# Patient Record
Sex: Female | Born: 1935 | Race: White | Hispanic: No | Smoking: Never smoker
Health system: Southern US, Community
[De-identification: ages and names within clinical notes are randomized; demographics above are authoritative.]

## PROBLEM LIST (undated history)

## (undated) DIAGNOSIS — E119 Type 2 diabetes mellitus without complications: Secondary | ICD-10-CM

## (undated) DIAGNOSIS — C801 Malignant (primary) neoplasm, unspecified: Secondary | ICD-10-CM

## (undated) DIAGNOSIS — E079 Disorder of thyroid, unspecified: Secondary | ICD-10-CM

## (undated) DIAGNOSIS — I509 Heart failure, unspecified: Secondary | ICD-10-CM

## (undated) DIAGNOSIS — F0390 Unspecified dementia without behavioral disturbance: Secondary | ICD-10-CM

## (undated) DIAGNOSIS — I2699 Other pulmonary embolism without acute cor pulmonale: Secondary | ICD-10-CM

## (undated) DIAGNOSIS — G459 Transient cerebral ischemic attack, unspecified: Secondary | ICD-10-CM

## (undated) DIAGNOSIS — I10 Essential (primary) hypertension: Secondary | ICD-10-CM

## (undated) DIAGNOSIS — E7800 Pure hypercholesterolemia, unspecified: Secondary | ICD-10-CM

## (undated) HISTORY — PX: ABDOMINAL HYSTERECTOMY: SHX81

---

## 2004-06-11 ENCOUNTER — Ambulatory Visit: Payer: Self-pay

## 2004-07-05 ENCOUNTER — Emergency Department: Payer: Self-pay | Admitting: Emergency Medicine

## 2004-07-05 ENCOUNTER — Other Ambulatory Visit: Payer: Self-pay

## 2012-12-19 LAB — COMPREHENSIVE METABOLIC PANEL
Alkaline Phosphatase: 69 U/L (ref 50–136)
Anion Gap: 6 — ABNORMAL LOW (ref 7–16)
Bilirubin,Total: 0.7 mg/dL (ref 0.2–1.0)
Co2: 24 mmol/L (ref 21–32)
EGFR (African American): >60
EGFR (Non-African Amer.): 55 — ABNORMAL LOW
Glucose: 150 mg/dL — ABNORMAL HIGH (ref 65–99)
Potassium: 3.7 mmol/L (ref 3.5–5.1)
SGOT(AST): 17 U/L (ref 15–37)
SGPT (ALT): 17 U/L (ref 12–78)
Total Protein: 6.8 g/dL (ref 6.4–8.2)

## 2012-12-19 LAB — CBC
HGB: 11 g/dL — ABNORMAL LOW (ref 12.0–16.0)
MCHC: 34.6 g/dL (ref 32.0–36.0)
MCV: 86 fL (ref 80–100)
Platelet: 130 10*3/uL — ABNORMAL LOW (ref 150–440)
RDW: 14.1 % (ref 11.5–14.5)
WBC: 6.9 10*3/uL (ref 3.6–11.0)

## 2012-12-19 LAB — TROPONIN I: Troponin-I: <0.02 ng/mL

## 2012-12-20 ENCOUNTER — Observation Stay: Payer: Self-pay | Admitting: Student

## 2012-12-20 LAB — URINALYSIS, COMPLETE
Bacteria: NONE SEEN
Blood: NEGATIVE
Glucose,UR: NEGATIVE mg/dL (ref 0–75)
Hyaline Cast: 1
Protein: NEGATIVE
RBC,UR: <1 /HPF (ref 0–5)
Specific Gravity: 1.016 (ref 1.003–1.030)
Squamous Epithelial: 1
WBC UR: 1 /HPF (ref 0–5)

## 2012-12-20 LAB — TSH: Thyroid Stimulating Horm: 0.591 u[IU]/mL

## 2012-12-20 LAB — IRON AND TIBC
Iron Bind.Cap.(Total): 340 ug/dL (ref 250–450)
Iron: 88 ug/dL (ref 50–170)
Unbound Iron-Bind.Cap.: 252 ug/dL

## 2014-09-21 NOTE — H&P (Signed)
PATIENT NAME:  Deborah Case, BRUTUS MR#:  [id number] DATE OF BIRTH:  June 24, 1929  DATE OF ADMISSION:  12/20/2012  PRIMARY CARE PHYSICIAN: Hortencia Pilar from [facility] in [place].   REFERRING PHYSICIAN: Marjean Donna.   CHIEF COMPLAINT: Altered mental status.   HISTORY OF PRESENT ILLNESS: Deborah Case is an 81 year old, pleasant, Caucasian female who lives at home alone, quite independent and doing well until the last 24 hours when she was noted to have altered mental status and not herself. Her neighbors were concerned. So were her daughter and granddaughter who live close to her home. They noted that she is confused. She is messing up her medications, lining up in the medication box, repeating the same medicine rather than taking all of her medications. She was confused in terms of the date and the President of the [place]. She was also talking about seeing cats. All of these are noted as a change that was never seen before in her condition. The patient was brought to the Emergency Department for evaluation. The patient is now in the process to be admitted for observation and follow up on her neurologic status. The patient, herself, denies any discomfort. She feels that she is fine. Had only complained that she has dizziness; however, this is a chronic complaint for her.   REVIEW OF SYSTEMS:   CONSTITUTIONAL: Denies any fever. No chills. No fatigue.  EYES: No blurring of vision. No double vision.  ENT: No hearing impairment. No sore throat. No dysphagia.  CARDIOVASCULAR: No chest pain. No shortness of breath. No syncope.  RESPIRATORY: No shortness of breath. No chest pain. No cough.  GASTROINTESTINAL: No abdominal pain. No vomiting. No diarrhea.  GENITOURINARY: No dysuria. No frequency of urination.  MUSCULOSKELETAL: No joint pain or swelling. No muscular pain or swelling.  INTEGUMENTARY: No skin rash. No ulcers.  NEUROLOGY: No focal weakness. No seizure activity. No headache.  PSYCHIATRY: No  anxiety. No depression.  ENDOCRINE: No polyuria or polydipsia. No heat or cold intolerance.   PAST MEDICAL HISTORY: Systemic hypertension, diabetes mellitus type 2, hypercholesterolemia, history of hypothyroidism, history of endometrial cancer, coronary artery disease status post stent implant.   PAST SURGICAL HISTORY: Hysterectomy cardiac stent implant at [facility] and cataract surgery.   ADMISSION MEDICATIONS: Plavix 75 mg a day, atorvastatin 80 mg a day, glipizide 5 mg twice a day, lisinopril 20 mg a day, metoprolol succinate 50 mg once a day, Synthroid 88 mcg once a day.   ALLERGIES: No known drug allergies.   SOCIAL HABITS: Nonsmoker. No history of alcohol or drug abuse.   SOCIAL HISTORY: She is widowed. Lives at home alone. She has her daughter next door who checks on her.   FAMILY HISTORY: She has no information about her father who died even before she was born. Her mother died of old age, but she suffered from asthma, diabetes and she had stomach cancer.   PHYSICAL EXAMINATION:  VITAL SIGNS: Blood pressure 150/60, respiratory rate 20, pulse 56, temperature 98.3, oxygen saturation 99%.  GENERAL APPEARANCE: This is an elderly female lying in bed in no acute distress, quite active and interactive.  HEAD AND NECK: No pallor. No icterus. No cyanosis. Ear examination revealed normal hearing, no discharge, no lesions. Examination of the nose showed no discharge, no bleeding, no ulcers. Oropharyngeal examination revealed normal lips and tongue. She is edentulous, wearing dentures for the upper jaw only. Eye examination revealed normal eyelids and conjunctivae. Pupils about 4 to 5 mm, round, equal  and reactive to light. Neck is supple. Trachea at midline. No thyromegaly. No cervical lymphadenopathy. No masses.  HEART: Normal S1, S2. No S3, S4. No murmur. No gallop. No carotid bruits.  RESPIRATORY: Normal breathing pattern without use of accessory muscles. No rales. No wheezing.   ABDOMEN: Soft without tenderness. No hepatosplenomegaly. No masses. No hernias.  SKIN: No ulcers. No subcutaneous nodules.  MUSCULOSKELETAL: No joint swelling. No clubbing.  NEUROLOGIC: Cranial nerves II through XII were intact. No focal motor deficit. Coordination movements were normal.  PSYCHIATRIC: The patient is alert, oriented to the place. She was able to recognize her granddaughter and her name, but she was confused about her address. For the President of the [place], she said [employer]; however, after a while she said it was [employer]. The date she thinks is April 2007. Mood and affect were normal.   LABORATORY FINDINGS: CAT scan of the head showed no acute intracranial abnormality. Her EKG showed normal sinus rhythm at rate of 64 per minute. Unremarkable EKG. Serum glucose 150, BUN 14, creatinine 0.9, sodium 137, potassium 3.7, calcium 9. Normal liver function tests and liver transaminases. Troponin less than 0.02. CBC showed white count of 6000, hemoglobin 11, hematocrit 31, platelet count 130. Normal MCV, MCH and MCHC. Urinalysis was unremarkable.   ASSESSMENT:  1. Acute altered mental status. Etiology is unclear but likely beginning of early dementia.   2. Diabetes mellitus, type 2.  3. Normocytic, normochromic anemia.  4. Mild thrombocytopenia.  5. Hypothyroidism.  6. Systemic hypertension.  7. Coronary artery disease, status post stent implant.  8. Hypercholesterolemia.  9. History of endometrial cancer, status post hysterectomy.   PLAN: Will admit the patient for observation with neurologic check-ups and followup. I will check her TSH. Check B12, iron and TIBC. Continue home medications as listed above. Upon discharge, the patient may need somebody to stay with her to keep an eye about any progress. For deep vein thrombosis prophylaxis, will place her on Lovenox. The patient does not have a Living Will; however, she states that her code status is FULL CODE. ( I want to live  longer !!) she stated.  Time spent in evaluating this patient took more than 55 minutes. I discussed her condition with her granddaughter who was in her presence in the Emergency Room Department.   ____________________________ Clovis Pu. Lenore Manner, MD amd:gb D: 12/20/2012 02:12:06 ET T: 12/20/2012 03:48:20 ET JOB#: [id number]  cc: Clovis Pu. Lenore Manner, MD, <Dictator> Ellin Saba MD ELECTRONICALLY SIGNED 12/20/2012 5:28

## 2014-09-21 NOTE — Consult Note (Signed)
Brief Consult Note: Comments: Pt was already discharged before she can be evalauted. Staff stated that called the cab and left. She was not seen during this admission.  Electronic Signatures: Jeronimo Norma (MD)  (Signed 22-Jul-14 14:39)  Authored: Brief Consult Note   Last Updated: 22-Jul-14 14:39 by Jeronimo Norma (MD)

## 2014-09-21 NOTE — Discharge Summary (Signed)
Case NAME:  Deborah Case, SPAID MR#:  [id number] DATE OF BIRTH:  November 28, 1929  DATE OF ADMISSION:  12/20/2012 DATE OF DISCHARGE:  12/20/2012  CHIEF COMPLAINT: Altered mental status.   DISCHARGE DIAGNOSES: 1.  Altered mental status, unclear etiology, possibly secondary to mild cognitive impairment, less likely delirium.  2.  History of hypothyroidism.  3.  Hypercholesterolemia.  4.  History of endometrial cancer.  5.  Coronary artery disease status post stenting in Deborah past.  6.  Diabetes.  7.  Hypertension.   DISCHARGE MEDICATIONS: 1.  Lisinopril 20 mg daily. 2.  Metoprolol succinate 50 mg once a day. 3.  Atorvastatin 80 mg daily. 4.  Synthroid 88 mcg daily. 5.  Glipizide 5 mg 2 times a day. 6.  Clopidogrel 75 mg daily.  DISCHARGE REFERRALS:  She will be getting discharged with home health, PT and RN.   DISCHARGE DIET:  Low sodium, ADA.  DISCHARGE ACTIVITY: As tolerated.   DISCHARGE INSTRUCTIONS: Please follow with PCP within 1 to 2 weeks.  SIGNIFICANT LABORATORY AND DIAGNOSTICS: Glucose 150. Iron 88. BUN 14, creatinine 0.97, sodium 137, potassium 3.7. TIBC 340.  LFTs within normal limits. Troponin negative. TSH 0.59. WBC 6.9, hemoglobin 11, platelets 130. UA not suggestive of infection.   CT of Deborah head:  No acute intracranial process.   HISTORY OF PRESENT ILLNESS AND HOSPITAL COURSE: For full details of H and P, please see Deborah dictation earlier today by Dr. Lenore Manner, but briefly this is an Deborah Case who lives by herself who came in with confusion, brought in by family. She was brought in by her daughter and granddaughter who live close to her. She had apparently possibly been taking lisinopril instead of her diabetes medications for Deborah last few days. She was acting not herself and therefore was brought in for observation. She had a CT of Deborah head which was negative. She apparently had been seeing cats where there were none. Deborah Case might have been more forgetful recently  as well, but appeared to be neurologically intact. Labs were ordered including a TSH, TIBC and electrolytes, a CAT scan of Deborah head, and all were not significantly abnormal. Deborah following morning Deborah Case had her clothes on and her bags and wanted to leave stating that there was nothing wrong with her. She appeared to be awake, alert and oriented x 3 and competent to me. Her granddaughter is 1 of our nurses in [facility] unit. I had an extensive conversation with Deborah daughter and Deborah granddaughter as well as Deborah Case. Deborah Case might be having mild cognitive impairment with forgetfulness and as she is living by herself Deborah issues have not been apparent until now. Delirium is less likely as she knew she was in Deborah hospital, new Deborah risks of taking excessive medications, and at this point she is awake, alert and oriented x 3. She was adamant about leaving and wanted to follow up with her primary care physician at [facility]. I had another discussion with Deborah granddaughter and Deborah Case and ultimately convinced her to have more monitoring and to have help in form of a home health nurse and PT. Deborah Case did complain of having some dizziness and orthostatics were checked and they were normal as well. At this point, as Deborah Case is adamant about leaving and is awake, alert and oriented x 3, I will go ahead and discharge her. Her granddaughter was okay with home health and PT. At this point, she will be discharged with outpatient  follow-up.  TOTAL TIME SPENT: 50 minutes. ____________________________ Vivien Presto, MD sa:sb D: 12/20/2012 15:52:33 ET T: 12/20/2012 16:23:46 ET JOB#: [id number]  cc: Vivien Presto, MD, <Dictator> Vivien Presto MD ELECTRONICALLY SIGNED 01/01/2013 11:37

## 2014-11-06 ENCOUNTER — Other Ambulatory Visit: Payer: Self-pay

## 2014-11-06 ENCOUNTER — Emergency Department: Payer: Medicare Other

## 2014-11-06 ENCOUNTER — Emergency Department
Admission: EM | Admit: 2014-11-06 | Discharge: 2014-11-06 | Disposition: A | Discharge status: Home / Self Care | Payer: Medicare Other | Attending: Emergency Medicine | Admitting: Emergency Medicine

## 2014-11-06 DIAGNOSIS — E860 Dehydration: Secondary | ICD-10-CM

## 2014-11-06 DIAGNOSIS — R350 Frequency of micturition: Secondary | ICD-10-CM | POA: Insufficient documentation

## 2014-11-06 DIAGNOSIS — R3915 Urgency of urination: Secondary | ICD-10-CM | POA: Diagnosis not present

## 2014-11-06 DIAGNOSIS — R531 Weakness: Secondary | ICD-10-CM | POA: Diagnosis present

## 2014-11-06 DIAGNOSIS — R42 Dizziness and giddiness: Secondary | ICD-10-CM

## 2014-11-06 DIAGNOSIS — Z79899 Other long term (current) drug therapy: Secondary | ICD-10-CM | POA: Diagnosis not present

## 2014-11-06 LAB — CBC WITH DIFFERENTIAL/PLATELET
BASOS ABS: 0 10*3/uL (ref 0–0.1)
Basophils Relative: 0 %
EOS ABS: 0 10*3/uL (ref 0–0.7)
Eosinophils Relative: 1 %
HCT: 34.1 % — ABNORMAL LOW (ref 35.0–47.0)
Hemoglobin: 11.4 g/dL — ABNORMAL LOW (ref 12.0–16.0)
LYMPHS ABS: 1.6 10*3/uL (ref 1.0–3.6)
Lymphocytes Relative: 29 %
MCH: 29.9 pg (ref 26.0–34.0)
MCHC: 33.4 g/dL (ref 32.0–36.0)
MCV: 89.5 fL (ref 80.0–100.0)
Monocytes Absolute: 0.4 10*3/uL (ref 0.2–0.9)
Monocytes Relative: 7 %
Neutro Abs: 3.5 10*3/uL (ref 1.4–6.5)
Neutrophils Relative %: 63 %
Platelets: 112 10*3/uL — ABNORMAL LOW (ref 150–440)
RBC: 3.81 MIL/uL (ref 3.80–5.20)
RDW: 14.3 % (ref 11.5–14.5)
WBC: 5.6 10*3/uL (ref 3.6–11.0)

## 2014-11-06 LAB — BASIC METABOLIC PANEL
Anion gap: 8 (ref 5–15)
BUN: 21 mg/dL — ABNORMAL HIGH (ref 6–20)
CO2: 26 mmol/L (ref 22–32)
Calcium: 9.2 mg/dL (ref 8.9–10.3)
Chloride: 107 mmol/L (ref 101–111)
Creatinine, Ser: 0.94 mg/dL (ref 0.44–1.00)
GFR calc Af Amer: >60 mL/min (ref >60)
GFR calc non Af Amer: 55 mL/min — ABNORMAL LOW (ref >60)
GLUCOSE: 154 mg/dL — AB (ref 65–99)
Potassium: 3.7 mmol/L (ref 3.5–5.1)
Sodium: 141 mmol/L (ref 135–145)

## 2014-11-06 LAB — URINALYSIS COMPLETE WITH MICROSCOPIC (ARMC ONLY)
BACTERIA UA: NONE SEEN
Bilirubin Urine: NEGATIVE
GLUCOSE, UA: NEGATIVE mg/dL
Hgb urine dipstick: NEGATIVE
KETONES UR: NEGATIVE mg/dL
Leukocytes, UA: NEGATIVE
NITRITE: NEGATIVE
PROTEIN: NEGATIVE mg/dL
Specific Gravity, Urine: 1.006 (ref 1.005–1.030)
WBC UA: NONE SEEN WBC/hpf (ref 0–5)
pH: 7 (ref 5.0–8.0)

## 2014-11-06 LAB — TROPONIN I: Troponin I: <0.03 ng/mL (ref <0.031)

## 2014-11-06 MED ORDER — SODIUM CHLORIDE 0.9 % IV BOLUS (SEPSIS)
500.0000 mL | Freq: Once | INTRAVENOUS | Status: AC
  Administered 2014-11-06: 500 mL via INTRAVENOUS

## 2014-11-06 NOTE — Discharge Instructions (Signed)
Dehydration Dehydration is when you lose more fluids from the body than you take in. Vital organs such as the kidneys, brain, and heart cannot function without a proper amount of fluids and salt. Any loss of fluids from the body can cause dehydration.  Older adults are at a higher risk of dehydration than younger adults. As we age, our bodies are less able to conserve water and do not respond to temperature changes as well. Also, older adults do not become thirsty as easily or quickly. Because of this, older adults often do not realize they need to increase fluids to avoid dehydration.  CAUSES   Vomiting.  Diarrhea.  Excessive sweating.  Excessive urination.  Fever.  Certain medicines, such as blood pressure medicines called diuretics.  Poorly controlled blood sugars. SIGNS AND SYMPTOMS  Mild dehydration:  Thirst.  Dry lips.  Slightly dry mouth. Moderate dehydration:  Very dry mouth.  Sunken eyes.  Skin does not bounce back quickly when lightly pinched and released.  Dark urine and decreased urine production.  Decreased tear production.  Headache. Severe dehydration:  Very dry mouth.  Extreme thirst.  Rapid, weak pulse (more than 100 beats per minute at rest).  Cold hands and feet.  Not able to sweat in spite of heat.  Rapid breathing.  Blue lips.  Confusion and lethargy.  Difficulty being awakened.  Minimal urine production.  No tears. DIAGNOSIS  Your health care provider will diagnose dehydration based on your symptoms and your exam. Blood and urine tests will help confirm the diagnosis. The diagnostic evaluation should also identify the cause of dehydration. TREATMENT  Treatment of mild or moderate dehydration can often be done at home by increasing the amount of fluids that you drink. It is best to drink small amounts of fluid more often. Drinking too much at one time can make vomiting worse. Severe dehydration needs to be treated at the hospital.  You may be given IV fluids that contain water and electrolytes. HOME CARE INSTRUCTIONS   Ask your health care provider about specific rehydration instructions.  Drink enough fluids to keep your urine clear or pale yellow.  Drink small amounts frequently if you have nausea and vomiting.  Eat as you normally do.  Avoid:  Foods or drinks high in sugar.  Carbonated drinks.  Juice.  Extremely hot or cold fluids.  Drinks with caffeine.  Fatty, greasy foods.  Alcohol.  Tobacco.  Overeating.  Gelatin desserts.  Wash your hands well to avoid spreading bacteria and viruses.  Only take over-the-counter or prescription medicines for pain, discomfort, or fever as directed by your health care provider.  Ask your health care provider if you should continue all prescribed and over-the-counter medicines.  Keep all follow-up appointments with your health care provider. SEEK MEDICAL CARE IF:  You have abdominal pain, and it increases or stays in one area (localizes).  You have a rash, stiff neck, or severe headache.  You are irritable, sleepy, or difficult to awaken.  You are weak, dizzy, or extremely thirsty.  You have a fever. SEEK IMMEDIATE MEDICAL CARE IF:   You are unable to keep fluids down, or you get worse despite treatment.  You have frequent episodes of vomiting or diarrhea.  You have blood or green matter (bile) in your vomit.  You have blood in your stool, or your stool looks black and tarry.  You have not urinated in 6-8 hours, or you have only urinated a small amount of very dark urine.    You faint. MAKE SURE YOU:   Understand these instructions.  Will watch your condition.  Will get help right away if you are not doing well or get worse. Document Released: 08/08/2003 Document Revised: 05/23/2013 Document Reviewed: 01/23/2013 Exit[employer] Patient Information 2015 [employer]. This information is not intended to replace advice given to you by your  health care provider. Make sure you discuss any questions you have with your health care provider.  

## 2014-11-06 NOTE — ED Notes (Signed)
Pt states she has had increased frequency and urgency to urinate. Also states she has had more "accidents" because she can't get to the toilet fast enough.

## 2014-11-06 NOTE — ED Notes (Signed)
Pt via ems from home; states she has been feeling weak x 2 weeks but it got worse today. Daughter states she has weakness in her legs and then staggers when she tries to stand. Pt has been experiencing higher than usual BP also lately. Pt is repetitive and seems confused but seems to answer questions appropriately.

## 2014-11-06 NOTE — ED Notes (Signed)
Pt discharged home after verbalizing understanding of discharge instructions; nad noted. 

## 2014-11-06 NOTE — ED Notes (Signed)
Pt not discharged; discharge entered on wrong pt

## 2014-11-06 NOTE — ED Notes (Signed)
Pt with weakness & dizziness x 2 weeks. Also admits to increased frequency and urgency of urination as of late.

## 2014-11-06 NOTE — ED Provider Notes (Signed)
-----------------------------------------   11:06 PM on 11/06/2014 -----------------------------------------  Patient is awake and alert. No distress. Labs reviewed, urinalysis does not demonstrate acute infection. She is imminently stable and in no distress.  Patient has been hydrated. Plan to discharge home and follow-up with her PCP discharge instructions provided as you by Dr. Joni Fears.    Delman Kitten, MD 11/06/14 2306

## 2014-11-06 NOTE — ED Provider Notes (Signed)
[facility] Emergency Department Provider Note  ____________________________________________  Time seen: 7:00 PM  I have reviewed the triage vital signs and the nursing notes.   HISTORY  Chief Complaint Weakness  History Limited by dementia  HPI Deborah Case is a 81 y.o. female who reports feeling weak and dizzy for the past 2 weeks. She states that she's been off balance when she tries to stand up and that her blood pressures also been higher than normal lately. She does state this is happened before and she's had problems with dizziness in the past. She also has increased urinary frequency and urgency and having some incontinence issues. She has also have history of multiple urinary tract infections.Patient denies any head injury, syncope, chest pain shortness of breath back pain and abdominal pain. No hip pain or any other musculoskeletal pain.     No past medical history on file. Dementia, hypertension, hypothyroidism There are no active problems to display for this patient.   No past surgical history on file.  Current Outpatient Rx  Name  Route  Sig  Dispense  Refill  . atorvastatin (LIPITOR) 80 MG tablet   Oral   Take 40 mg by mouth daily.         . clopidogrel (PLAVIX) 75 MG tablet   Oral   Take 75 mg by mouth daily.         Marland Kitchen donepezil (ARICEPT) 5 MG tablet   Oral   Take 5 mg by mouth at bedtime.         Marland Kitchen levothyroxine (SYNTHROID, LEVOTHROID) 88 MCG tablet   Oral   Take 88 mcg by mouth daily before breakfast.         . lisinopril (PRINIVIL,ZESTRIL) 20 MG tablet   Oral   Take 20 mg by mouth daily.         . metoprolol succinate (TOPROL-XL) 25 MG 24 hr tablet   Oral   Take 25 mg by mouth daily.         . nitroGLYCERIN (NITROSTAT) 0.4 MG SL tablet   Sublingual   Place 0.4 mg under the tongue every 5 (five) minutes as needed for chest pain.           Allergies Metformin and related  No family history on  file.  Social History History  Substance Use Topics  . Smoking status: Not on file  . Smokeless tobacco: Not on file  . Alcohol Use: Not on file    Review of Systems  Constitutional: No fever or chills. No weight changes Eyes:No blurry vision or double vision.  ENT: No sore throat. Cardiovascular: No chest pain. Respiratory: No dyspnea or cough. Gastrointestinal: Negative for abdominal pain, vomiting and diarrhea.  No BRBPR or melena. Genitourinary: Frequency and urgency. Musculoskeletal: Negative for back pain. No joint swelling or pain. Skin: Negative for rash. Neurological: Negative for headaches, focal weakness or numbness. Positive dizziness Psychiatric:No anxiety or depression.   Endocrine:No hot/cold intolerance, changes in energy, or sleep difficulty.  10-point ROS otherwise negative.  ____________________________________________   PHYSICAL EXAM:  VITAL SIGNS: ED Triage Vitals  Enc Vitals Group     BP 11/06/14 1900 209/71 mmHg     Pulse Rate 11/06/14 1900 61     Resp 11/06/14 1900 17     Temp 11/06/14 1904 97.8 F (36.6 C)     Temp Source 11/06/14 1904 Oral     SpO2 11/06/14 1900 99 %     Weight 11/06/14 1904  141 lb (63.957 kg)     Height 11/06/14 1904 5\' 2"  (1.575 m)     Head Cir --      Peak Flow --      Pain Score --      Pain Loc --      Pain Edu? --      Excl. in [place]? --      Constitutional: Alert and oriented. Well appearing and in no distress. Eyes: No scleral icterus. No conjunctival pallor. PERRL. EOMI ENT   Head: Normocephalic and atraumatic.   Nose: No congestion/rhinnorhea. No septal hematoma   Mouth/Throat: MMM, no pharyngeal erythema. No peritonsillar mass. No uvula shift.   Neck: No stridor. No SubQ emphysema. No meningismus. Hematological/Lymphatic/Immunilogical: No cervical lymphadenopathy. Cardiovascular: RRR. Normal and symmetric distal pulses are present in all extremities. No murmurs, rubs, or  gallops. Respiratory: Normal respiratory effort without tachypnea nor retractions. Breath sounds are clear and equal bilaterally. No wheezes/rales/rhonchi. Gastrointestinal: Soft and nontender. No distention. There is no CVA tenderness.  No rebound, rigidity, or guarding. Genitourinary: deferred Musculoskeletal: Nontender with normal range of motion in all extremities. No joint effusions.  No lower extremity tenderness.  No edema. Neurologic:   Normal speech and language.  CN 2-10 normal. Motor grossly intact. No pronator drift.  Normal gait. Able to pull self upright on stretcher and hold herself in a seated position No gross focal neurologic deficits are appreciated.  Skin:  Skin is warm, dry and intact. No rash noted.  No petechiae, purpura, or bullae. Psychiatric: Mood and affect are normal. Speech and behavior are normal. Patient exhibits appropriate insight and judgment.  ____________________________________________    LABS (pertinent positives/negatives) (all labs ordered are listed, but only abnormal results are displayed) Labs Reviewed  BASIC METABOLIC PANEL  TROPONIN I  CBC WITH DIFFERENTIAL/PLATELET  URINALYSIS COMPLETEWITH MICROSCOPIC ([place])   ____________________________________________   EKG  Interpreted by me Normal sinus rhythm rate of 61, normal axis intervals QRS and ST segments and T waves  ____________________________________________    RADIOLOGY  Chest x-ray unremarkable  ____________________________________________   PROCEDURES  ____________________________________________   INITIAL IMPRESSION / ASSESSMENT AND PLAN / ED COURSE  Pertinent labs & imaging results that were available during my care of the patient were reviewed by me and considered in my medical decision making (see chart for details).  Patient presents with acute on chronic dizziness which may be related to a urinary tract infection. We will check labs and a UA and  reassess. I'll attempt to obtain further history from her daughter if available. ----------------------------------------- 9:10 PM on 11/06/2014 -----------------------------------------  Additional history obtained from daughter who notes that this is a recurrent issue for the patient she has poor oral intake often does not eat or drink much during the day. She has been seen by her doctor in the [facility] ER recently for the same and has improved with IV fluids and had negative workups and then discharged home. Patient is feeling well and is anxious to go home the daughter agrees with the patient appears to be at her baseline.  Care the patient will be signed out to Dr. Jacqualine Code to follow-up on the labs. If workup does not reveal any severe abnormalities, The patient will be stable for discharge home and follow-up with her primary care doctor. ____________________________________________   FINAL CLINICAL IMPRESSION(S) / ED DIAGNOSES  Final diagnoses:  Dizziness  Mild dehydration      Carrie Mew, MD 11/06/14 2113

## 2014-11-06 NOTE — ED Notes (Signed)
Assisted pt to toilet. No urine caught in the hat.

## 2014-11-26 ENCOUNTER — Emergency Department
Admission: EM | Admit: 2014-11-26 | Discharge: 2014-11-27 | Disposition: A | Discharge status: Home / Self Care | Payer: Medicare Other | Attending: Emergency Medicine | Admitting: Emergency Medicine

## 2014-11-26 ENCOUNTER — Emergency Department: Payer: Medicare Other

## 2014-11-26 ENCOUNTER — Other Ambulatory Visit: Payer: Self-pay

## 2014-11-26 ENCOUNTER — Encounter: Payer: Self-pay | Admitting: Emergency Medicine

## 2014-11-26 DIAGNOSIS — R110 Nausea: Secondary | ICD-10-CM | POA: Diagnosis not present

## 2014-11-26 DIAGNOSIS — R1084 Generalized abdominal pain: Secondary | ICD-10-CM | POA: Insufficient documentation

## 2014-11-26 DIAGNOSIS — F0390 Unspecified dementia without behavioral disturbance: Secondary | ICD-10-CM | POA: Insufficient documentation

## 2014-11-26 DIAGNOSIS — Z79899 Other long term (current) drug therapy: Secondary | ICD-10-CM | POA: Insufficient documentation

## 2014-11-26 DIAGNOSIS — R109 Unspecified abdominal pain: Secondary | ICD-10-CM | POA: Diagnosis present

## 2014-11-26 DIAGNOSIS — I10 Essential (primary) hypertension: Secondary | ICD-10-CM | POA: Insufficient documentation

## 2014-11-26 DIAGNOSIS — Z7902 Long term (current) use of antithrombotics/antiplatelets: Secondary | ICD-10-CM | POA: Insufficient documentation

## 2014-11-26 DIAGNOSIS — E119 Type 2 diabetes mellitus without complications: Secondary | ICD-10-CM | POA: Diagnosis not present

## 2014-11-26 HISTORY — DX: Other pulmonary embolism without acute cor pulmonale: I26.99

## 2014-11-26 HISTORY — DX: Malignant (primary) neoplasm, unspecified: C80.1

## 2014-11-26 HISTORY — DX: Essential (primary) hypertension: I10

## 2014-11-26 HISTORY — DX: Disorder of thyroid, unspecified: E07.9

## 2014-11-26 HISTORY — DX: Type 2 diabetes mellitus without complications: E11.9

## 2014-11-26 HISTORY — DX: Unspecified dementia, unspecified severity, without behavioral disturbance, psychotic disturbance, mood disturbance, and anxiety: F03.90

## 2014-11-26 HISTORY — DX: Pure hypercholesterolemia, unspecified: E78.00

## 2014-11-26 HISTORY — DX: Heart failure, unspecified: I50.9

## 2014-11-26 LAB — URINALYSIS COMPLETE WITH MICROSCOPIC (ARMC ONLY)
BACTERIA UA: NONE SEEN
BILIRUBIN URINE: NEGATIVE
GLUCOSE, UA: 50 mg/dL — AB
Hgb urine dipstick: NEGATIVE
Ketones, ur: NEGATIVE mg/dL
LEUKOCYTES UA: NEGATIVE
Nitrite: NEGATIVE
Protein, ur: NEGATIVE mg/dL
SPECIFIC GRAVITY, URINE: 1.009 (ref 1.005–1.030)
pH: 7 (ref 5.0–8.0)

## 2014-11-26 LAB — COMPREHENSIVE METABOLIC PANEL
ALBUMIN: 3.7 g/dL (ref 3.5–5.0)
ALK PHOS: 59 U/L (ref 38–126)
ALT: 13 U/L — AB (ref 14–54)
AST: 17 U/L (ref 15–41)
Anion gap: 7 (ref 5–15)
BILIRUBIN TOTAL: 0.9 mg/dL (ref 0.3–1.2)
BUN: 15 mg/dL (ref 6–20)
CALCIUM: 9 mg/dL (ref 8.9–10.3)
CO2: 27 mmol/L (ref 22–32)
CREATININE: 0.82 mg/dL (ref 0.44–1.00)
Chloride: 104 mmol/L (ref 101–111)
GFR calc Af Amer: >60 mL/min (ref >60)
Glucose, Bld: 158 mg/dL — ABNORMAL HIGH (ref 65–99)
Potassium: 3.6 mmol/L (ref 3.5–5.1)
Sodium: 138 mmol/L (ref 135–145)
Total Protein: 6 g/dL — ABNORMAL LOW (ref 6.5–8.1)

## 2014-11-26 LAB — CBC WITH DIFFERENTIAL/PLATELET
Basophils Absolute: 0 10*3/uL (ref 0–0.1)
Basophils Relative: 0 %
EOS ABS: 0 10*3/uL (ref 0–0.7)
EOS PCT: 1 %
HEMATOCRIT: 34.3 % — AB (ref 35.0–47.0)
Hemoglobin: 11.5 g/dL — ABNORMAL LOW (ref 12.0–16.0)
LYMPHS ABS: 0.8 10*3/uL — AB (ref 1.0–3.6)
Lymphocytes Relative: 16 %
MCH: 29.5 pg (ref 26.0–34.0)
MCHC: 33.4 g/dL (ref 32.0–36.0)
MCV: 88.3 fL (ref 80.0–100.0)
MONO ABS: 0.3 10*3/uL (ref 0.2–0.9)
MONOS PCT: 6 %
Neutro Abs: 3.9 10*3/uL (ref 1.4–6.5)
Neutrophils Relative %: 77 %
PLATELETS: 104 10*3/uL — AB (ref 150–440)
RBC: 3.88 MIL/uL (ref 3.80–5.20)
RDW: 14 % (ref 11.5–14.5)
WBC: 5 10*3/uL (ref 3.6–11.0)

## 2014-11-26 LAB — LIPASE, BLOOD: Lipase: 52 U/L — ABNORMAL HIGH (ref 22–51)

## 2014-11-26 LAB — TROPONIN I: Troponin I: <0.03 ng/mL (ref <0.031)

## 2014-11-26 MED ORDER — IOHEXOL 300 MG/ML  SOLN
100.0000 mL | Freq: Once | INTRAMUSCULAR | Status: AC | PRN
  Administered 2014-11-26: 100 mL via INTRAVENOUS

## 2014-11-26 MED ORDER — IOHEXOL 240 MG/ML SOLN
25.0000 mL | Freq: Once | INTRAMUSCULAR | Status: AC | PRN
  Administered 2014-11-26: 25 mL via ORAL

## 2014-11-26 MED ORDER — SODIUM CHLORIDE 0.9 % IV BOLUS (SEPSIS)
500.0000 mL | Freq: Once | INTRAVENOUS | Status: AC
Start: 2014-11-26 — End: 2014-11-26
  Administered 2014-11-26: 500 mL via INTRAVENOUS

## 2014-11-26 MED ORDER — ONDANSETRON HCL 4 MG/2ML IJ SOLN
INTRAMUSCULAR | Status: AC
  Administered 2014-11-26: 4 mg via INTRAVENOUS
  Filled 2014-11-26: qty 2

## 2014-11-26 MED ORDER — ONDANSETRON HCL 4 MG/2ML IJ SOLN
4.0000 mg | Freq: Once | INTRAMUSCULAR | Status: AC
  Administered 2014-11-26: 4 mg via INTRAVENOUS

## 2014-11-26 MED ORDER — MORPHINE SULFATE 2 MG/ML IJ SOLN
INTRAMUSCULAR | Status: AC
  Administered 2014-11-26: 2 mg via INTRAVENOUS
  Filled 2014-11-26: qty 1

## 2014-11-26 MED ORDER — MORPHINE SULFATE 2 MG/ML IJ SOLN
2.0000 mg | Freq: Once | INTRAMUSCULAR | Status: AC
  Administered 2014-11-26: 2 mg via INTRAVENOUS

## 2014-11-26 NOTE — ED Notes (Signed)
BP reported to Dr. Clearnce Hasten

## 2014-11-26 NOTE — ED Notes (Signed)
Pt arrived via EMS with c/o feeling nauseated and dry heaving and upper abdominal pain.  Denies any vomiting states she has been having warm water coming up in her mouth. Pt reports burping and belching as well.

## 2014-11-26 NOTE — ED Provider Notes (Signed)
[facility] Emergency Department Provider Note  ____________________________________________  Time seen: Seen upon arrival to the emergency department.  I have reviewed the triage vital signs and the nursing notes.   HISTORY  Chief Complaint Abdominal Pain    HPI Deborah Case is a 81 y.o. female with a history of dementia, hypertension and diabetes who presents today with nausea and abdominal pain for one week. She says the abdominal pain is diffuse and cramping. She has had associated generalized weakness. No vomiting or diarrhea. No known sick contacts. No increased frequency of urination or burning on urination. Denies any chest pain or shortness of breath.   Past Medical History  Diagnosis Date  . Dementia   . Diabetes mellitus without complication   . Hypertension   . Hypercholesteremia   . Thyroid disease   . Pulmonary embolism     There are no active problems to display for this patient.   Past Surgical History  Procedure Laterality Date  . Abdominal hysterectomy      Current Outpatient Rx  Name  Route  Sig  Dispense  Refill  . atorvastatin (LIPITOR) 80 MG tablet   Oral   Take 40 mg by mouth daily.         . clopidogrel (PLAVIX) 75 MG tablet   Oral   Take 75 mg by mouth daily.         Marland Kitchen donepezil (ARICEPT) 5 MG tablet   Oral   Take 5 mg by mouth at bedtime.         Marland Kitchen levothyroxine (SYNTHROID, LEVOTHROID) 88 MCG tablet   Oral   Take 88 mcg by mouth daily before breakfast.         . lisinopril (PRINIVIL,ZESTRIL) 20 MG tablet   Oral   Take 20 mg by mouth daily.         . metoprolol succinate (TOPROL-XL) 25 MG 24 hr tablet   Oral   Take 25 mg by mouth daily.         . nitroGLYCERIN (NITROSTAT) 0.4 MG SL tablet   Sublingual   Place 0.4 mg under the tongue every 5 (five) minutes as needed for chest pain.           Allergies Metformin and related  History reviewed. No pertinent family history.  Social  History History  Substance Use Topics  . Smoking status: Never Smoker   . Smokeless tobacco: Never Used  . Alcohol Use: No    Review of Systems Constitutional: No fever/chills Eyes: No visual changes. ENT: No sore throat. Cardiovascular: Denies chest pain. Respiratory: Denies shortness of breath. Gastrointestinal: no vomiting.  No diarrhea.  No constipation. Genitourinary: Negative for dysuria. Musculoskeletal: Negative for back pain. Skin: Negative for rash. Neurological: Negative for headaches, focal weakness or numbness.  10-point ROS otherwise negative.  ____________________________________________   PHYSICAL EXAM:  VITAL SIGNS: ED Triage Vitals  Enc Vitals Group     BP 11/26/14 2114 228/73 mmHg     Pulse Rate 11/26/14 2114 48     Resp 11/26/14 2114 17     Temp 11/26/14 2114 97.6 F (36.4 C)     Temp Source 11/26/14 2114 Oral     SpO2 11/26/14 2114 100 %     Weight 11/26/14 2114 140 lb (63.504 kg)     Height 11/26/14 2114 5\' 2"  (1.575 m)     Head Cir --      Peak Flow --      Pain  Score --      Pain Loc --      Pain Edu? --      Excl. in [place]? --     Constitutional: Alert and oriented. Well appearing and in no acute distress. Eyes: Conjunctivae are normal. PERRL. EOMI. Head: Atraumatic. Nose: No congestion/rhinnorhea. Mouth/Throat: Mucous membranes are moist.  Oropharynx non-erythematous. Neck: No stridor.   Cardiovascular: Normal rate, regular rhythm. Grossly normal heart sounds.  Good peripheral circulation. Respiratory: Normal respiratory effort.  No retractions. Lungs CTAB. Gastrointestinal: Soft with diffuse tenderness to palpation.. No distention. No abdominal bruits. No CVA tenderness. Musculoskeletal: No lower extremity tenderness nor edema.  No joint effusions. Neurologic:  Normal speech and language. No gross focal neurologic deficits are appreciated. Speech is normal. No gait instability. Skin:  Skin is warm, dry and intact. No rash  noted. Psychiatric: Mood and affect are normal. Speech and behavior are normal.  ____________________________________________   LABS (all labs ordered are listed, but only abnormal results are displayed)  Labs Reviewed  CBC WITH DIFFERENTIAL/PLATELET - Abnormal; Notable for the following:    Hemoglobin 11.5 (*)    HCT 34.3 (*)    Platelets 104 (*)    Lymphs Abs 0.8 (*)    All other components within normal limits  COMPREHENSIVE METABOLIC PANEL - Abnormal; Notable for the following:    Glucose, Bld 158 (*)    Total Protein 6.0 (*)    ALT 13 (*)    All other components within normal limits  LIPASE, BLOOD - Abnormal; Notable for the following:    Lipase 52 (*)    All other components within normal limits  TROPONIN I  URINALYSIS COMPLETEWITH MICROSCOPIC ([facility] ONLY)   ____________________________________________  EKG  ED ECG REPORT I, Doran Stabler, the attending physician, personally viewed and interpreted this ECG.   Date: 11/26/2014  EKG Time: 2124  Rate: 47  Rhythm: sinus bradycardia  Axis: Normal axis  Intervals:none  ST&T Change: No ST elevations or depressions. No abnormal T-wave inversions.  ____________________________________________  RADIOLOGY  Pending CAT scan results of the abdomen and pelvis. ____________________________________________   PROCEDURES    ____________________________________________   INITIAL IMPRESSION / ASSESSMENT AND PLAN / ED COURSE  Pertinent labs & imaging results that were available during my care of the patient were reviewed by me and considered in my medical decision making (see chart for details).  ----------------------------------------- 10:28 PM on 11/26/2014 -----------------------------------------  CTA labs still pending. Signed out to Dr. Archie Balboa for follow-up of results and disposition. ____________________________________________   FINAL CLINICAL IMPRESSION(S) / ED DIAGNOSES  Diffuse abdominal  pain. Initial visit    Orbie Pyo, MD 11/26/14 2228

## 2014-11-26 NOTE — ED Provider Notes (Signed)
-----------------------------------------   11:59 PM on 11/26/2014 -----------------------------------------  On exam at this point patient states she feels much better. Abdomen is soft. Completely nontender to palpation. Blood work and urine without overly concerning findings, minimally elevated lipase. CT scan without any concerning acute findings. I did discuss with the patient and family the need to get follow-up MRI to evaluate for kidney lesion. Will discharge home at this point.  Nance Pear, MD 11/27/14 0000

## 2014-11-27 NOTE — Discharge Instructions (Signed)
As discussed please follow up with your primary care physician to obtain an MRI to further evaluate your kidney lesion.Please seek medical attention for any high fevers, chest pain, shortness of breath, change in behavior, persistent vomiting, bloody stool or any other new or concerning symptoms.  Abdominal Pain Many things can cause abdominal pain. Usually, abdominal pain is not caused by a disease and will improve without treatment. It can often be observed and treated at home. Your health care provider will do a physical exam and possibly order blood tests and X-rays to help determine the seriousness of your pain. However, in many cases, more time must pass before a clear cause of the pain can be found. Before that point, your health care provider may not know if you need more testing or further treatment. HOME CARE INSTRUCTIONS  Monitor your abdominal pain for any changes. The following actions may help to alleviate any discomfort you are experiencing:  Only take over-the-counter or prescription medicines as directed by your health care provider.  Do not take laxatives unless directed to do so by your health care provider.  Try a clear liquid diet (broth, tea, or water) as directed by your health care provider. Slowly move to a bland diet as tolerated. SEEK MEDICAL CARE IF:  You have unexplained abdominal pain.  You have abdominal pain associated with nausea or diarrhea.  You have pain when you urinate or have a bowel movement.  You experience abdominal pain that wakes you in the night.  You have abdominal pain that is worsened or improved by eating food.  You have abdominal pain that is worsened with eating fatty foods.  You have a fever. SEEK IMMEDIATE MEDICAL CARE IF:   Your pain does not go away within 2 hours.  You keep throwing up (vomiting).  Your pain is felt only in portions of the abdomen, such as the right side or the left lower portion of the abdomen.  You pass  bloody or black tarry stools. MAKE SURE YOU:  Understand these instructions.   Will watch your condition.   Will get help right away if you are not doing well or get worse.  Document Released: 02/25/2005 Document Revised: 05/23/2013 Document Reviewed: 01/25/2013 [facility] Patient Information 2015 [place], [place]. This information is not intended to replace advice given to you by your health care provider. Make sure you discuss any questions you have with your health care provider.

## 2015-06-27 ENCOUNTER — Other Ambulatory Visit: Payer: Self-pay | Admitting: Family Medicine

## 2015-06-27 DIAGNOSIS — Z780 Asymptomatic menopausal state: Secondary | ICD-10-CM

## 2017-03-29 ENCOUNTER — Emergency Department: Payer: Medicare Other

## 2017-03-29 ENCOUNTER — Emergency Department
Admission: EM | Admit: 2017-03-29 | Discharge: 2017-03-29 | Disposition: A | Discharge status: Home / Self Care | Payer: Medicare Other | Attending: Emergency Medicine | Admitting: Emergency Medicine

## 2017-03-29 DIAGNOSIS — I110 Hypertensive heart disease with heart failure: Secondary | ICD-10-CM | POA: Insufficient documentation

## 2017-03-29 DIAGNOSIS — E039 Hypothyroidism, unspecified: Secondary | ICD-10-CM | POA: Diagnosis not present

## 2017-03-29 DIAGNOSIS — I509 Heart failure, unspecified: Secondary | ICD-10-CM | POA: Insufficient documentation

## 2017-03-29 DIAGNOSIS — Z8542 Personal history of malignant neoplasm of other parts of uterus: Secondary | ICD-10-CM | POA: Diagnosis not present

## 2017-03-29 DIAGNOSIS — Z79899 Other long term (current) drug therapy: Secondary | ICD-10-CM | POA: Insufficient documentation

## 2017-03-29 DIAGNOSIS — F0390 Unspecified dementia without behavioral disturbance: Secondary | ICD-10-CM | POA: Insufficient documentation

## 2017-03-29 DIAGNOSIS — E119 Type 2 diabetes mellitus without complications: Secondary | ICD-10-CM | POA: Diagnosis not present

## 2017-03-29 DIAGNOSIS — I10 Essential (primary) hypertension: Secondary | ICD-10-CM

## 2017-03-29 DIAGNOSIS — Z7902 Long term (current) use of antithrombotics/antiplatelets: Secondary | ICD-10-CM | POA: Insufficient documentation

## 2017-03-29 DIAGNOSIS — R4182 Altered mental status, unspecified: Secondary | ICD-10-CM | POA: Diagnosis present

## 2017-03-29 LAB — COMPREHENSIVE METABOLIC PANEL
ALT: 15 U/L (ref 14–54)
ANION GAP: 5 (ref 5–15)
AST: 20 U/L (ref 15–41)
Albumin: 3.6 g/dL (ref 3.5–5.0)
Alkaline Phosphatase: 71 U/L (ref 38–126)
BILIRUBIN TOTAL: 1 mg/dL (ref 0.3–1.2)
BUN: 22 mg/dL — ABNORMAL HIGH (ref 6–20)
CO2: 25 mmol/L (ref 22–32)
Calcium: 8.9 mg/dL (ref 8.9–10.3)
Chloride: 101 mmol/L (ref 101–111)
Creatinine, Ser: 1.06 mg/dL — ABNORMAL HIGH (ref 0.44–1.00)
GFR, EST AFRICAN AMERICAN: 54 mL/min — AB (ref >60)
GFR, EST NON AFRICAN AMERICAN: 47 mL/min — AB (ref >60)
Glucose, Bld: 153 mg/dL — ABNORMAL HIGH (ref 65–99)
POTASSIUM: 3.8 mmol/L (ref 3.5–5.1)
Sodium: 131 mmol/L — ABNORMAL LOW (ref 135–145)
TOTAL PROTEIN: 6 g/dL — AB (ref 6.5–8.1)

## 2017-03-29 LAB — CBC
HEMATOCRIT: 31.4 % — AB (ref 35.0–47.0)
HEMOGLOBIN: 10.6 g/dL — AB (ref 12.0–16.0)
MCH: 30.5 pg (ref 26.0–34.0)
MCHC: 33.7 g/dL (ref 32.0–36.0)
MCV: 90.5 fL (ref 80.0–100.0)
Platelets: 123 10*3/uL — ABNORMAL LOW (ref 150–440)
RBC: 3.47 MIL/uL — ABNORMAL LOW (ref 3.80–5.20)
RDW: 14.1 % (ref 11.5–14.5)
WBC: 6.6 10*3/uL (ref 3.6–11.0)

## 2017-03-29 LAB — LIPASE, BLOOD: Lipase: 55 U/L — ABNORMAL HIGH (ref 11–51)

## 2017-03-29 MED ORDER — HYDRALAZINE HCL 20 MG/ML IJ SOLN: 5.0000 mg | Freq: Once | INTRAMUSCULAR | Status: DC

## 2017-03-29 MED ORDER — LABETALOL HCL 5 MG/ML IV SOLN: 10.0000 mg | Freq: Once | INTRAVENOUS | Status: DC

## 2017-03-29 MED ORDER — HYDRALAZINE HCL 20 MG/ML IJ SOLN
2.0000 mg | Freq: Once | INTRAMUSCULAR | Status: AC
  Administered 2017-03-29: 2 mg via INTRAVENOUS
  Filled 2017-03-29: qty 1

## 2017-03-29 NOTE — ED Provider Notes (Signed)
[facility] Emergency Department Provider Note  ____________________________________________  Time seen: Approximately 8:34 PM  I have reviewed the triage vital signs and the nursing notes.   HISTORY  Chief Complaint Altered Mental Status  Level 5 Caveat: Portions of the History and Physical were unable to be obtained due to altered mental status due to chronic dementia.   HPI Deborah Case is a 83 y.o. female who called EMS herself today due to dizziness. EMS arrived and brought the patient to the hospital, and on arrival to the hospital the patient does remember how she got here or who called EMS. Patient reports that her daughter gives her all her medicines every day and lives next door. Family reports that the patient has been taking all of her medicines. She does have high blood pressure which gets much worse whenever she is in a health care setting but has been fairly well controlled at home on her usual medications. No falls or other trauma. Family reports that the patient is at her baseline mental status.     Past Medical History:  Diagnosis Date  . Cancer ([place])    uterine  . CHF (congestive heart failure) ([place])   . Dementia   . Diabetes mellitus without complication ([place])   . Hypercholesteremia   . Hypertension   . Pulmonary embolism ([place])   . Thyroid disease      There are no active problems to display for this patient.    Past Surgical History:  Procedure Laterality Date  . ABDOMINAL HYSTERECTOMY       Prior to Admission medications   Medication Sig Start Date End Date Taking? Authorizing Provider  atorvastatin (LIPITOR) 80 MG tablet Take 40 mg by mouth daily.    [provider]  clopidogrel (PLAVIX) 75 MG tablet Take 75 mg by mouth daily.    [provider]  donepezil (ARICEPT) 5 MG tablet Take 5 mg by mouth at bedtime.    [provider]  levothyroxine (SYNTHROID, LEVOTHROID) 88 MCG tablet Take 88 mcg  by mouth daily before breakfast.    [provider]  lisinopril (PRINIVIL,ZESTRIL) 20 MG tablet Take 20 mg by mouth daily.    [provider]  metoprolol succinate (TOPROL-XL) 25 MG 24 hr tablet Take 25 mg by mouth daily.    [provider]  nitroGLYCERIN (NITROSTAT) 0.4 MG SL tablet Place 0.4 mg under the tongue every 5 (five) minutes as needed for chest pain.    [provider]     Allergies Metformin and related   No family history on file.  Social History Social History  Substance Use Topics  . Smoking status: Never Smoker  . Smokeless tobacco: Never Used  . Alcohol use No    Review of Systems Unable to reliably obtained due to altered mental status from dementia  ____________________________________________   PHYSICAL EXAM:  VITAL SIGNS: ED Triage Vitals  Enc Vitals Group     BP 03/29/17 1824 (!) 237/84     Pulse Rate 03/29/17 1824 (!) 59     Resp 03/29/17 1824 17     Temp 03/29/17 1824 98.4 F (36.9 C)     Temp src --      SpO2 03/29/17 1824 100 %     Weight 03/29/17 1825 140 lb (63.5 kg)     Height --      Head Circumference --      Peak Flow --      Pain Score --  Pain Loc --      Pain Edu? --      Excl. in [place]? --     Vital signs reviewed, nursing assessments reviewed.   Constitutional:   Alert and oriented to self. Well appearing and in no distress. Eyes:   No scleral icterus.  EOMI. No nystagmus. No conjunctival pallor. PERRL. ENT   Head:   Normocephalic and atraumatic.   Nose:   No congestion/rhinnorhea.    Mouth/Throat:   MMM, no pharyngeal erythema. No peritonsillar mass.    Neck:   No meningismus. Full ROM. Hematological/Lymphatic/Immunilogical:   No cervical lymphadenopathy. Cardiovascular:   RRR. Symmetric bilateral radial and DP pulses.  No murmurs.  Respiratory:   Normal respiratory effort without tachypnea/retractions. Breath sounds are clear and equal bilaterally. No  wheezes/rales/rhonchi. Gastrointestinal:   Soft and nontender. Non distended. There is no CVA tenderness.  No rebound, rigidity, or guarding. Genitourinary:   deferred Musculoskeletal:   Normal range of motion in all extremities. No joint effusions.  No lower extremity tenderness.  No edema. Neurologic:   Normal speech, confused.  Motor grossly intact. No gross focal neurologic deficits are appreciated.  Skin:    Skin is warm, dry and intact. No rash noted.  No petechiae, purpura, or bullae.  ____________________________________________    LABS (pertinent positives/negatives) (all labs ordered are listed, but only abnormal results are displayed) Labs Reviewed  COMPREHENSIVE METABOLIC PANEL - Abnormal; Notable for the following:       Result Value   Sodium 131 (*)    Glucose, Bld 153 (*)    BUN 22 (*)    Creatinine, Ser 1.06 (*)    Total Protein 6.0 (*)    GFR calc non Af Amer 47 (*)    GFR calc Af Amer 54 (*)    All other components within normal limits  CBC - Abnormal; Notable for the following:    RBC 3.47 (*)    Hemoglobin 10.6 (*)    HCT 31.4 (*)    Platelets 123 (*)    All other components within normal limits  LIPASE, BLOOD - Abnormal; Notable for the following:    Lipase 55 (*)    All other components within normal limits   ____________________________________________   EKG  Interpreted by me Sinus rhythm rate of 61, normal axis and intervals. Normal QRS ST segments and T waves  ____________________________________________    RADIOLOGY  Ct Head Wo Contrast  Result Date: 03/29/2017 CLINICAL DATA:  Dizziness and hypertension EXAM: CT HEAD WITHOUT CONTRAST TECHNIQUE: Contiguous axial images were obtained from the base of the skull through the vertex without intravenous contrast. COMPARISON:  Head CT 12/19/2012 FINDINGS: Brain: No mass lesion, intraparenchymal hemorrhage or extra-axial collection. No evidence of acute cortical infarct. There is periventricular  hypoattenuation compatible with chronic microvascular disease. Vascular: No hyperdense vessel or unexpected calcification. Skull: Normal visualized skull base, calvarium and extracranial soft tissues. Sinuses/Orbits: No sinus fluid levels or advanced mucosal thickening. No mastoid effusion. Normal orbits. IMPRESSION: Findings of chronic ischemic microangiopathy without acute intracranial abnormality. Electronically Signed   By: Ulyses Jarred M.D.   On: 03/29/2017 19:14   Dg Chest Portable 1 View  Result Date: 03/29/2017 CLINICAL DATA:  Pt arrived via [facility] from home by herself. Per EMS, pt c/o "dizziness/swimmy headedness". Per EMS, pt's BP 240/100 and pt denies taking any medications. EXAM: PORTABLE CHEST 1 VIEW COMPARISON:  Radiograph 11/06/2014 FINDINGS: Normal mediastinum and cardiac silhouette. Cardiac stent noted. Normal pulmonary vasculature. No evidence  of effusion, infiltrate, or pneumothorax. No acute bony abnormality. IMPRESSION: No acute cardiopulmonary process. Electronically Signed   By: Suzy Bouchard M.D.   On: 03/29/2017 19:11    ____________________________________________   PROCEDURES Procedures  ____________________________________________   DIFFERENTIAL DIAGNOSIS  Uncontrolled hypertension, chronic dementia, intracranial hemorrhage, stroke  CLINICAL IMPRESSION / ASSESSMENT AND PLAN / ED COURSE  Pertinent labs & imaging results that were available during my care of the patient were reviewed by me and considered in my medical decision making (see chart for details).   Patient well-appearing no acute distress, presents with complaint of dizziness and markedly elevated blood pressure. There is a report that the patient's blood pressure is normally controlled but does get severely elevated when she is at a health care setting. Check CT head and chest x-ray, check labs.  Clinical Course as of Mar 29 2033  Mon Mar 29, 2017  1948 Cxr and ct head neg. Family provides  reassuring hx, pt is at baseline condition and behavior. Plan to DC home, f/u pcp, continue home meds.   [PS]    Clinical Course User Index [PS] Carrie Mew, MD    ----------------------------------------- 8:45 PM on 03/29/2017 -----------------------------------------  Blood pressure improved to 185/66. Would be hesitant to try and reduce it further given were started. Family confirms that the patient is at baseline mental status, they had no concerns about her health today, and the patient called EMS herself. The patient does not recall calling EMS and doesn't seem to have any acute complaints herself at this time. Workup is negative and reassuring, discharge home to follow up with primary care. At this point a low suspicion for stroke intracranial hemorrhage meningitis encephalitis ACS. Not consistent with malignant hypertension   ____________________________________________   FINAL CLINICAL IMPRESSION(S) / ED DIAGNOSES    Final diagnoses:  Dementia without behavioral disturbance, unspecified dementia type  Essential hypertension      New Prescriptions   No medications on file     Portions of this note were generated with dragon dictation software. Dictation errors may occur despite best attempts at proofreading.    Carrie Mew, MD 03/29/17 2046

## 2017-03-29 NOTE — Discharge Instructions (Signed)
Your blood tests and CT scan of the brain today were unremarkable. Follow up with your doctor for continued monitoring of your blood pressure, which was high today (220/80).

## 2017-03-29 NOTE — ED Notes (Signed)
Pt discharged to home.  Family member driving.  Discharge instructions reviewed.  Verbalized understanding.  No questions or concerns at this time.  Teach back verified.  Pt in NAD.  No items left in ED.   

## 2017-03-29 NOTE — ED Triage Notes (Signed)
Pt arrived via [facility] from home by herself.  Per EMS, pt c/o "dizziness/swimmy headedness".  Per EMS, pt's BP 240/100 and pt denies taking any medications.  EMS states that her doctor took her off of all medications.  Pt repeating self frequently during triage and EMS states the same during transport.  Attempted to call phone number on file for patient's daughter Lelan Pons, no answer, left hipaa compliant message.  Also attempted to call patient's granddaughter Anderson [place] with number on file, no answer, left hipaa compliant message.  Pt is alert to self, but unaware of date, situation or location at this time.

## 2017-08-17 ENCOUNTER — Other Ambulatory Visit: Payer: Self-pay

## 2017-08-17 ENCOUNTER — Emergency Department: Payer: Medicare Other

## 2017-08-17 ENCOUNTER — Emergency Department
Admission: EM | Admit: 2017-08-17 | Discharge: 2017-08-18 | Disposition: A | Discharge status: Home / Self Care | Payer: Medicare Other | Attending: Emergency Medicine | Admitting: Emergency Medicine

## 2017-08-17 ENCOUNTER — Encounter: Payer: Self-pay | Admitting: *Deleted

## 2017-08-17 DIAGNOSIS — E119 Type 2 diabetes mellitus without complications: Secondary | ICD-10-CM | POA: Insufficient documentation

## 2017-08-17 DIAGNOSIS — Z7902 Long term (current) use of antithrombotics/antiplatelets: Secondary | ICD-10-CM | POA: Diagnosis not present

## 2017-08-17 DIAGNOSIS — Z8541 Personal history of malignant neoplasm of cervix uteri: Secondary | ICD-10-CM | POA: Insufficient documentation

## 2017-08-17 DIAGNOSIS — I110 Hypertensive heart disease with heart failure: Secondary | ICD-10-CM | POA: Diagnosis not present

## 2017-08-17 DIAGNOSIS — Z79899 Other long term (current) drug therapy: Secondary | ICD-10-CM | POA: Diagnosis not present

## 2017-08-17 DIAGNOSIS — I509 Heart failure, unspecified: Secondary | ICD-10-CM | POA: Diagnosis not present

## 2017-08-17 DIAGNOSIS — F0390 Unspecified dementia without behavioral disturbance: Secondary | ICD-10-CM | POA: Diagnosis not present

## 2017-08-17 DIAGNOSIS — W19XXXA Unspecified fall, initial encounter: Secondary | ICD-10-CM

## 2017-08-17 DIAGNOSIS — R531 Weakness: Secondary | ICD-10-CM | POA: Diagnosis present

## 2017-08-17 LAB — CBC WITH DIFFERENTIAL/PLATELET
BASOS ABS: 0 10*3/uL (ref 0–0.1)
Basophils Relative: 0 %
EOS PCT: 1 %
Eosinophils Absolute: 0.1 10*3/uL (ref 0–0.7)
HCT: 33.8 % — ABNORMAL LOW (ref 35.0–47.0)
HEMOGLOBIN: 11 g/dL — AB (ref 12.0–16.0)
LYMPHS PCT: 33 %
Lymphs Abs: 1.9 10*3/uL (ref 1.0–3.6)
MCH: 28.8 pg (ref 26.0–34.0)
MCHC: 32.6 g/dL (ref 32.0–36.0)
MCV: 88.4 fL (ref 80.0–100.0)
Monocytes Absolute: 0.4 10*3/uL (ref 0.2–0.9)
Monocytes Relative: 8 %
NEUTROS ABS: 3.4 10*3/uL (ref 1.4–6.5)
NEUTROS PCT: 58 %
PLATELETS: 127 10*3/uL — AB (ref 150–440)
RBC: 3.82 MIL/uL (ref 3.80–5.20)
RDW: 14.8 % — ABNORMAL HIGH (ref 11.5–14.5)
WBC: 5.8 10*3/uL (ref 3.6–11.0)

## 2017-08-17 LAB — BASIC METABOLIC PANEL
ANION GAP: 6 (ref 5–15)
BUN: 23 mg/dL — ABNORMAL HIGH (ref 6–20)
CHLORIDE: 104 mmol/L (ref 101–111)
CO2: 24 mmol/L (ref 22–32)
Calcium: 8.7 mg/dL — ABNORMAL LOW (ref 8.9–10.3)
Creatinine, Ser: 0.87 mg/dL (ref 0.44–1.00)
GFR calc Af Amer: >60 mL/min (ref >60)
GFR, EST NON AFRICAN AMERICAN: 59 mL/min — AB (ref >60)
GLUCOSE: 121 mg/dL — AB (ref 65–99)
POTASSIUM: 4.6 mmol/L (ref 3.5–5.1)
Sodium: 134 mmol/L — ABNORMAL LOW (ref 135–145)

## 2017-08-17 LAB — ETHANOL: Alcohol, Ethyl (B): <10 mg/dL (ref <10)

## 2017-08-17 LAB — URINALYSIS, COMPLETE (UACMP) WITH MICROSCOPIC
BACTERIA UA: NONE SEEN
BILIRUBIN URINE: NEGATIVE
Glucose, UA: NEGATIVE mg/dL
Hgb urine dipstick: NEGATIVE
KETONES UR: NEGATIVE mg/dL
LEUKOCYTES UA: NEGATIVE
NITRITE: NEGATIVE
PH: 7 (ref 5.0–8.0)
Protein, ur: NEGATIVE mg/dL
Specific Gravity, Urine: 1.002 — ABNORMAL LOW (ref 1.005–1.030)
WBC, UA: NONE SEEN WBC/hpf (ref 0–5)

## 2017-08-17 LAB — TROPONIN I: Troponin I: <0.03 ng/mL (ref <0.03)

## 2017-08-17 MED ORDER — DIAZEPAM 5 MG PO TABS
5.0000 mg | ORAL_TABLET | Freq: Once | ORAL | Status: AC
  Administered 2017-08-17: 5 mg via ORAL
  Filled 2017-08-17: qty 1

## 2017-08-17 NOTE — ED Triage Notes (Signed)
EMS to home after call out for weakness, dizziness and fall. Pt has had increased weakness x 2 months. She lives alone and family lies next door. She is pleasantly confused and repeating herself over and over. She is concerned about how she will get home and if her home is clean enough for company. VSS, CBG 169

## 2017-08-17 NOTE — ED Provider Notes (Signed)
[facility] Emergency Department Provider Note ____________________________________________   First MD Initiated Contact with Patient 08/17/17 2115     (approximate)  I have reviewed the triage vital signs and the nursing notes.   HISTORY  Chief Complaint Weakness and Fall  Level 5 caveat: Unable to obtain complete HPI due to dementia  HPI STEPAHNIE Case is a 84 y.o. female with past medical history as noted below who presents for unclear reasons.  Per EMS, the patient called EMS for self with a complaint of hyperglycemia.  On their arrival, the patient was noted to be apparently weak and unsteady.  At that time she did not want to come to the hospital but was convinced by EMS.  Per EMS, the patient lives alone but her family lives nearby.  The patient at this time has no complaints, and cannot identify any reason why she is in the emergency department.  Past Medical History:  Diagnosis Date  . Cancer ([place])    uterine  . CHF (congestive heart failure) ([place])   . Dementia   . Diabetes mellitus without complication ([place])   . Hypercholesteremia   . Hypertension   . Pulmonary embolism ([place])   . Thyroid disease     There are no active problems to display for this patient.   Past Surgical History:  Procedure Laterality Date  . ABDOMINAL HYSTERECTOMY      Prior to Admission medications   Medication Sig Start Date End Date Taking? Authorizing Provider  atorvastatin (LIPITOR) 40 MG tablet Take 40 mg by mouth daily.   Yes [provider]  carvedilol (COREG) 6.25 MG tablet Take 6.25 mg by mouth 2 (two) times daily with a meal.   Yes [provider]  clopidogrel (PLAVIX) 75 MG tablet Take 75 mg by mouth daily.   Yes [provider]  levothyroxine (SYNTHROID, LEVOTHROID) 88 MCG tablet Take 88 mcg by mouth daily before breakfast.   Yes [provider]  lisinopril (PRINIVIL,ZESTRIL) 40 MG tablet Take 40 mg by mouth daily.     Yes [provider]    Allergies Metformin and related  History reviewed. No pertinent family history.  Social History Social History   Tobacco Use  . Smoking status: Never Smoker  . Smokeless tobacco: Never Used  Substance Use Topics  . Alcohol use: No  . Drug use: No    Review of Systems Level 5 caveat: Unable to obtain review of systems due to dementia   ____________________________________________   PHYSICAL EXAM:  VITAL SIGNS: ED Triage Vitals  Enc Vitals Group     BP 08/17/17 2041 (!) 213/61     Pulse Rate 08/17/17 2041 (!) 56     Resp 08/17/17 2041 14     Temp 08/17/17 2041 97.8 F (36.6 C)     Temp Source 08/17/17 2041 Oral     SpO2 08/17/17 2041 100 %     Weight 08/17/17 2042 138 lb (62.6 kg)     Height 08/17/17 2042 5\' 2"  (1.575 m)     Head Circumference --      Peak Flow --      Pain Score --      Pain Loc --      Pain Edu? --      Excl. in [place]? --     Constitutional: Alert, slightly confused.  Comfortable appearing. Eyes: Conjunctivae are normal.  EOMI.  PERRLA. Head: Atraumatic. Nose: No congestion/rhinnorhea. Mouth/Throat: Mucous membranes are moist.  Neck: Normal range of motion.  Cardiovascular: Normal rate, regular rhythm. Grossly normal heart sounds.  Good peripheral circulation. Respiratory: Normal respiratory effort.  No retractions. Lungs CTAB. Gastrointestinal: Soft and nontender. No distention.  Genitourinary: No flank tenderness. Musculoskeletal: No lower extremity edema.  Extremities warm and well perfused.  Neurologic:  Normal speech and language. No gross focal neurologic deficits are appreciated.  Skin:  Skin is warm and dry. No rash noted. Psychiatric: Calm and cooperative.  ____________________________________________   LABS (all labs ordered are listed, but only abnormal results are displayed)  Labs Reviewed  CBC WITH DIFFERENTIAL/PLATELET - Abnormal; Notable for the following components:      Result Value     Hemoglobin 11.0 (*)    HCT 33.8 (*)    RDW 14.8 (*)    Platelets 127 (*)    All other components within normal limits  BASIC METABOLIC PANEL - Abnormal; Notable for the following components:   Sodium 134 (*)    Glucose, Bld 121 (*)    BUN 23 (*)    Calcium 8.7 (*)    GFR calc non Af Amer 59 (*)    All other components within normal limits  URINALYSIS, COMPLETE (UACMP) WITH MICROSCOPIC - Abnormal; Notable for the following components:   Color, Urine COLORLESS (*)    APPearance CLEAR (*)    Specific Gravity, Urine 1.002 (*)    Squamous Epithelial / LPF 0-5 (*)    All other components within normal limits  TROPONIN I  ETHANOL   ____________________________________________  EKG   ____________________________________________  RADIOLOGY  CXR: No focal infiltrate CT head: Pending  ____________________________________________   PROCEDURES  Procedure(s) performed: No  Procedures  Critical Care performed: No ____________________________________________   INITIAL IMPRESSION / ASSESSMENT AND PLAN / ED COURSE  Pertinent labs & imaging results that were available during my care of the patient were reviewed by me and considered in my medical decision making (see chart for details).  84 year old female with history as noted above including history of dementia presents after she apparently called EMS complaining of hypoglycemia.  EMS noted that the patient appeared somewhat unsteady and weak.  There was some history given triage of possible fall, but this was not confirmed to me by EMS.  On exam, the vital signs are normal except for hypertension, and the patient is alert and confused but does not appear acutely altered.  She is unable to articulate any specific complaints.  I reviewed the past medical records in [place]; patient was last seen in the ED last year when she called EMS with a complaint of dizziness and then had a negative workup.  Since patient's actual complaint  or concern is unclear, the differential is broad.  We will obtain basic AMS/confusion workup including basic labs, UA, chest x-ray, troponin, and then reassess.  If workup is negative and we can assure safe discharge, the patient might be able to be discharged home.  ----------------------------------------- 12:04 AM on 08/18/2017 -----------------------------------------  Patient's workup shows no significant acute abnormalities.  There is no evidence of infection.  The patient is ambulating with steady gait without difficulty.  She continues to be confused.  She states that she lives with her mother, which I suspect is unlikely given the patient's age.  I attempted to contact the next of kin in the context in epic, but they did not pick up the phone.  At this time, even though the patient's workup is negative,  since we cannot determine with certainty  what the patient's baseline is, or whether she is in fact safe in her current living situation at home, and since I cannot contact any family members, I do not feel that it is safe to discharge the patient home at this time.  However the patient also does not warrant admission based on the negative workup so far.  I will add on a CT head to rule out any acute intracranial pathology.  If it is negative, the patient will board in the ED until we can either get in touch with the family member to pick her up, or consult social work in the morning to help make arrangements for her discharge.  I signed the patient out to the oncoming physician Dr. Dahlia Client.       ____________________________________________   FINAL CLINICAL IMPRESSION(S) / ED DIAGNOSES  Final diagnoses:  Dementia without behavioral disturbance, unspecified dementia type      NEW MEDICATIONS STARTED DURING THIS VISIT:  New Prescriptions   No medications on file     Note:  This document was prepared using Dragon voice recognition software and may include unintentional  dictation errors.     Arta Silence, MD 08/18/17 [phone]

## 2017-08-18 ENCOUNTER — Emergency Department: Payer: Medicare Other

## 2017-08-18 NOTE — ED Notes (Addendum)
Daughter Lelan Pons called and will be here about 8am to pick her up. She states she has been working with pts doctor to meet her needs and keep her in her home. [phone]

## 2017-08-18 NOTE — ED Notes (Signed)
Attempts to call pts family unsuccessful. Pt not safe at this time to be brought home by EMS with no family at her home to assist. Will remain here in ED overnight and will contact family or social worker in the am

## 2017-08-18 NOTE — ED Notes (Signed)
Pt eating breakfast and drinking coffee

## 2017-08-18 NOTE — ED Provider Notes (Signed)
-----------------------------------------   8:57 AM on 08/18/2017 -----------------------------------------   Blood pressure 122/69, pulse 64, temperature 97.7 F (36.5 C), temperature source Oral, resp. rate 18, height 5\' 2"  (1.575 m), weight 62.6 kg (138 lb), SpO2 100 %.  Assuming care from Dr. Cherylann Banas.  In short, Deborah Case is a 84 y.o. female with a chief complaint of Weakness and Fall .  Refer to the original H&P for additional details.  The current plan of care is to have the patient evaluated by social work to have her placed as we are unable to get into contact with her family..     The patient's family called and states that they are available to come and pick up the patient.  They report that they both live right next door to the patient.  The patient is at her baseline.  We will encourage the family to have the patient evaluated by her primary care physician for possible placement.  We will also encourage someone to stay with her so she does not fall or herself in the near future.   Loney Hering, MD 08/18/17 [phone]

## 2017-08-18 NOTE — Discharge Instructions (Signed)
Please have the patient evaluated by her primary care physician.  While it is understandable that there is family that lives nearby please have someone spend time with her so that she does not fall at home by herself.

## 2018-07-13 ENCOUNTER — Emergency Department: Payer: Medicare Other

## 2018-07-13 ENCOUNTER — Emergency Department
Admission: EM | Admit: 2018-07-13 | Discharge: 2018-07-13 | Disposition: A | Discharge status: Home / Self Care | Payer: Medicare Other | Attending: Emergency Medicine | Admitting: Emergency Medicine

## 2018-07-13 ENCOUNTER — Encounter: Payer: Self-pay | Admitting: Emergency Medicine

## 2018-07-13 ENCOUNTER — Other Ambulatory Visit: Payer: Self-pay

## 2018-07-13 DIAGNOSIS — Z79899 Other long term (current) drug therapy: Secondary | ICD-10-CM | POA: Diagnosis not present

## 2018-07-13 DIAGNOSIS — W19XXXA Unspecified fall, initial encounter: Secondary | ICD-10-CM | POA: Insufficient documentation

## 2018-07-13 DIAGNOSIS — F0390 Unspecified dementia without behavioral disturbance: Secondary | ICD-10-CM | POA: Insufficient documentation

## 2018-07-13 DIAGNOSIS — I509 Heart failure, unspecified: Secondary | ICD-10-CM | POA: Insufficient documentation

## 2018-07-13 DIAGNOSIS — Z8542 Personal history of malignant neoplasm of other parts of uterus: Secondary | ICD-10-CM | POA: Insufficient documentation

## 2018-07-13 DIAGNOSIS — E119 Type 2 diabetes mellitus without complications: Secondary | ICD-10-CM | POA: Diagnosis not present

## 2018-07-13 DIAGNOSIS — R42 Dizziness and giddiness: Secondary | ICD-10-CM | POA: Insufficient documentation

## 2018-07-13 DIAGNOSIS — Z7902 Long term (current) use of antithrombotics/antiplatelets: Secondary | ICD-10-CM | POA: Insufficient documentation

## 2018-07-13 DIAGNOSIS — I110 Hypertensive heart disease with heart failure: Secondary | ICD-10-CM | POA: Diagnosis not present

## 2018-07-13 LAB — BASIC METABOLIC PANEL
Anion gap: 6 (ref 5–15)
BUN: 25 mg/dL — AB (ref 8–23)
CALCIUM: 9.5 mg/dL (ref 8.9–10.3)
CO2: 26 mmol/L (ref 22–32)
Chloride: 107 mmol/L (ref 98–111)
Creatinine, Ser: 0.95 mg/dL (ref 0.44–1.00)
GFR calc Af Amer: >60 mL/min (ref >60)
GFR, EST NON AFRICAN AMERICAN: 54 mL/min — AB (ref >60)
GLUCOSE: 115 mg/dL — AB (ref 70–99)
POTASSIUM: 4.4 mmol/L (ref 3.5–5.1)
SODIUM: 139 mmol/L (ref 135–145)

## 2018-07-13 LAB — URINALYSIS, COMPLETE (UACMP) WITH MICROSCOPIC
BILIRUBIN URINE: NEGATIVE
Bacteria, UA: NONE SEEN
GLUCOSE, UA: NEGATIVE mg/dL
HGB URINE DIPSTICK: NEGATIVE
Ketones, ur: NEGATIVE mg/dL
Leukocytes,Ua: NEGATIVE
NITRITE: NEGATIVE
PH: 7 (ref 5.0–8.0)
Protein, ur: NEGATIVE mg/dL
Specific Gravity, Urine: 1.005 (ref 1.005–1.030)

## 2018-07-13 LAB — CBC
HCT: 34.7 % — ABNORMAL LOW (ref 36.0–46.0)
Hemoglobin: 10.7 g/dL — ABNORMAL LOW (ref 12.0–15.0)
MCH: 29.2 pg (ref 26.0–34.0)
MCHC: 30.8 g/dL (ref 30.0–36.0)
MCV: 94.6 fL (ref 80.0–100.0)
PLATELETS: 132 10*3/uL — AB (ref 150–400)
RBC: 3.67 MIL/uL — ABNORMAL LOW (ref 3.87–5.11)
RDW: 13.7 % (ref 11.5–15.5)
WBC: 6 10*3/uL (ref 4.0–10.5)
nRBC: 0 % (ref 0.0–0.2)

## 2018-07-13 LAB — TROPONIN I

## 2018-07-13 MED ORDER — LIDOCAINE HCL (PF) 1 % IJ SOLN
INTRAMUSCULAR | Status: AC
  Administered 2018-07-13: 5 mL
  Filled 2018-07-13: qty 5

## 2018-07-13 MED ORDER — LIDOCAINE HCL (PF) 1 % IJ SOLN
5.0000 mL | Freq: Once | INTRAMUSCULAR | Status: AC
  Administered 2018-07-13: 5 mL

## 2018-07-13 NOTE — Discharge Instructions (Addendum)
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  Return to the emergency department for any symptoms personally concerning to yourself.

## 2018-07-13 NOTE — ED Triage Notes (Signed)
Pt arrives with APS with concerns over fall last night with unknown injury. Pt reports pain to left thumb. Upon assessment abnormality noticed at tricep.circling around neck. Area soft and no tenderness observed. Pt alert and oriented to place and situation but not oriented to time. Unclear baseline mentation. Pt was recently taken over my adult protective service.

## 2018-07-13 NOTE — ED Notes (Signed)
Pt assisted to bathroom at this time.

## 2018-07-13 NOTE — ED Notes (Signed)
Patient transported to X-ray 

## 2018-07-13 NOTE — ED Provider Notes (Addendum)
[facility] Emergency Department Provider Note  Time seen: 2:40 PM  I have reviewed the triage vital signs and the nursing notes.   HISTORY  Chief Complaint Fall and Dizziness    HPI Deborah Case is a 85 y.o. female with a past medical history of CHF, dementia, diabetes, hypertension, hyperlipidemia, presents to the emergency department after a fall.  According to the patient and Adult Protective Services workers were with her patient and she had a fall yesterday.  Patient complaining of left thumb pain today is her only complaint.  Patient seems unsteady ambulating so they brought her to the emergency department for evaluation.  Currently the patient appears well, she is alert and oriented to person and place.  Her only complaint is left thumb discomfort.   Past Medical History:  Diagnosis Date  . Cancer ([place])    uterine  . CHF (congestive heart failure) ([place])   . Dementia ([place])   . Diabetes mellitus without complication ([place])   . Hypercholesteremia   . Hypertension   . Pulmonary embolism ([place])   . Thyroid disease     There are no active problems to display for this patient.   Past Surgical History:  Procedure Laterality Date  . ABDOMINAL HYSTERECTOMY      Prior to Admission medications   Medication Sig Start Date End Date Taking? Authorizing Provider  atorvastatin (LIPITOR) 40 MG tablet Take 40 mg by mouth daily.    [provider]  carvedilol (COREG) 6.25 MG tablet Take 6.25 mg by mouth 2 (two) times daily with a meal.    [provider]  clopidogrel (PLAVIX) 75 MG tablet Take 75 mg by mouth daily.    [provider]  levothyroxine (SYNTHROID, LEVOTHROID) 88 MCG tablet Take 88 mcg by mouth daily before breakfast.    [provider]  lisinopril (PRINIVIL,ZESTRIL) 40 MG tablet Take 40 mg by mouth daily.     [provider]    Allergies  Allergen Reactions  . Metformin And Related     No family  history on file.  Social History Social History   Tobacco Use  . Smoking status: Never Smoker  . Smokeless tobacco: Never Used  Substance Use Topics  . Alcohol use: No  . Drug use: No    Review of Systems Constitutional: Negative for fever. Cardiovascular: Negative for chest pain. Respiratory: Negative for shortness of breath. Gastrointestinal: Negative for abdominal pain, vomiting Genitourinary: Negative for urinary compaints (patient smells strongly of urine) Musculoskeletal: Left thumb pain Neurological: Negative for headache All other ROS negative  ____________________________________________   PHYSICAL EXAM:  VITAL SIGNS: ED Triage Vitals  Enc Vitals Group     BP 07/13/18 1312 (!) 210/53     Pulse Rate 07/13/18 1312 (!) 54     Resp 07/13/18 1312 14     Temp 07/13/18 1312 97.6 F (36.4 C)     Temp Source 07/13/18 1312 Oral     SpO2 07/13/18 1312 100 %     Weight 07/13/18 1315 135 lb (61.2 kg)     Height 07/13/18 1315 5\' 2"  (1.575 m)     Head Circumference --      Peak Flow --      Pain Score 07/13/18 1329 8     Pain Loc --      Pain Edu? --      Excl. in [place]? --    Constitutional: Alert, oriented to person and place.  Here with APS  workers.  No distress.  Patient does smell strongly of urine. Eyes: Normal exam ENT   Head: Normocephalic and atraumatic.   Mouth/Throat: Mucous membranes are moist. Cardiovascular: Normal rate, regular rhythm.  Respiratory: Normal respiratory effort without tachypnea nor retractions. Breath sounds are clear  Gastrointestinal: Soft and nontender. No distention.   Musculoskeletal: Good range of motion all extremities including bilateral shoulders and upper extremities, bilateral hips.  Able to ambulate although seems somewhat ataxic ambulating, it is unclear what her baseline is.  Patient does have a deformity to her left thumb with tenderness to the area. Neurologic:  Normal speech and language. No gross focal neurologic  deficits Skin:  Skin is warm, dry and intact.  Psychiatric: Mood and affect are normal.   ____________________________________________    EKG  EKG viewed and interpreted by myself shows sinus bradycardia at 53 bpm with a narrow QRS, normal axis, normal intervals, no concerning ST changes.  Reassuring EKG.  ____________________________________________    RADIOLOGY  Chest x-ray is largely negative.  Slight irregularity of left humeral head, however patient has great range of motion left shoulder without discomfort.  Thumb x-ray shows MCP dislocation without fracture.  IMPRESSION: Atrophy with small vessel chronic ischemic changes of deep cerebral white matter.  No acute intracranial abnormalities.  ____________________________________________   INITIAL IMPRESSION / ASSESSMENT AND PLAN / ED COURSE  Pertinent labs & imaging results that were available during my care of the patient were reviewed by me and considered in my medical decision making (see chart for details).  Patient presents to the emergency department after a fall yesterday, unsteady on her feet per APS workers.  We will check labs, urinalysis obtain CT imaging of the head x-rays of the chest and thumb.  Imaging shows left MCP dislocation of the left thumb.  Patient's blood work is largely within normal limits, urinalysis pending.  We will obtain CT imaging of the head which is pending as well.  CT imaging is negative.  I performed a digital block on the patient's left thumb, attempted reduction however unsuccessful.  There is absolutely no swelling around the thumb which would indicate this is likely a chronic dislocation.  I discussed this with the APS worker who was there when she fell yesterday saying that she did not hit her thumb yesterday and thought it was deformed before the fall.  This is likely a chronic dislocation we will have the patient follow-up with orthopedics regarding this.    Urinalysis is negative.   We will discharge into the care of the APS workers.    ____________________________________________   FINAL CLINICAL IMPRESSION(S) / ED DIAGNOSES  Fall Left thumb dislocation   Harvest Dark, MD 07/13/18 1506    Harvest Dark, MD 07/13/18 1610

## 2018-08-07 ENCOUNTER — Emergency Department
Admission: EM | Admit: 2018-08-07 | Discharge: 2018-08-07 | Disposition: A | Discharge status: Home / Self Care | Payer: Medicare Other | Attending: Student in an Organized Health Care Education/Training Program | Admitting: Student in an Organized Health Care Education/Training Program

## 2018-08-07 ENCOUNTER — Emergency Department: Payer: Medicare Other

## 2018-08-07 ENCOUNTER — Encounter: Payer: Self-pay | Admitting: Emergency Medicine

## 2018-08-07 DIAGNOSIS — Z8542 Personal history of malignant neoplasm of other parts of uterus: Secondary | ICD-10-CM | POA: Insufficient documentation

## 2018-08-07 DIAGNOSIS — M25552 Pain in left hip: Secondary | ICD-10-CM

## 2018-08-07 DIAGNOSIS — E119 Type 2 diabetes mellitus without complications: Secondary | ICD-10-CM | POA: Insufficient documentation

## 2018-08-07 DIAGNOSIS — W010XXA Fall on same level from slipping, tripping and stumbling without subsequent striking against object, initial encounter: Secondary | ICD-10-CM | POA: Insufficient documentation

## 2018-08-07 DIAGNOSIS — I110 Hypertensive heart disease with heart failure: Secondary | ICD-10-CM | POA: Diagnosis not present

## 2018-08-07 DIAGNOSIS — Z79899 Other long term (current) drug therapy: Secondary | ICD-10-CM | POA: Insufficient documentation

## 2018-08-07 DIAGNOSIS — F0390 Unspecified dementia without behavioral disturbance: Secondary | ICD-10-CM | POA: Insufficient documentation

## 2018-08-07 DIAGNOSIS — Z7902 Long term (current) use of antithrombotics/antiplatelets: Secondary | ICD-10-CM | POA: Insufficient documentation

## 2018-08-07 DIAGNOSIS — I509 Heart failure, unspecified: Secondary | ICD-10-CM | POA: Insufficient documentation

## 2018-08-07 DIAGNOSIS — Y92129 Unspecified place in nursing home as the place of occurrence of the external cause: Secondary | ICD-10-CM | POA: Insufficient documentation

## 2018-08-07 DIAGNOSIS — R51 Headache: Secondary | ICD-10-CM | POA: Insufficient documentation

## 2018-08-07 DIAGNOSIS — W19XXXA Unspecified fall, initial encounter: Secondary | ICD-10-CM

## 2018-08-07 NOTE — ED Notes (Signed)
RN attempted to contact pt's legal guardian and [facility] supervisor without success. Pt to be transported back to [facility] by [employer].

## 2018-08-07 NOTE — Discharge Instructions (Addendum)
Follow-up with PCP.  Return to the ER if you have any worsening symptoms, questions or concerns.

## 2018-08-07 NOTE — ED Notes (Signed)
Pt unable to sign for discharge. Pt discharged with EMS for transport to [facility].

## 2018-08-07 NOTE — ED Notes (Signed)
Pt discharged with [facility] for transport to [facility]. Pt unable to to sign for discharge. Pt NAD at this time.

## 2018-08-07 NOTE — ED Triage Notes (Signed)
Pt to ED with c/o of left hip pain and head pain after witnessed fall. Per staff no LOC. Upon arrival pt has small hematoma to top of head and c/o of left hip pain with movement.

## 2018-08-07 NOTE — ED Provider Notes (Signed)
[facility] Emergency Department Provider Note    First MD Initiated Contact with Patient 08/07/18 1038     (approximate)  I have reviewed the triage vital signs and the nursing notes.   HISTORY  Chief Complaint Fall  Level V CaveaT:  dementia  HPI Deborah Case is a 85 y.o. female   presents the ER for evaluation after mechanical fall.  Patient complaining of left hip pain as well as headache.  Past medical history listed below.  It was a witnessed fall.  Did not have loss of consciousness.  States the pain is mild and reproducible with palpation of the left hip.   Past Medical History:  Diagnosis Date  . Cancer ([place])    uterine  . CHF (congestive heart failure) ([place])   . Dementia ([place])   . Diabetes mellitus without complication ([place])   . Hypercholesteremia   . Hypertension   . Pulmonary embolism ([place])   . Thyroid disease    History reviewed. No pertinent family history. Past Surgical History:  Procedure Laterality Date  . ABDOMINAL HYSTERECTOMY     There are no active problems to display for this patient.     Prior to Admission medications   Medication Sig Start Date End Date Taking? Authorizing Provider  atorvastatin (LIPITOR) 40 MG tablet Take 40 mg by mouth daily.    [provider]  carvedilol (COREG) 6.25 MG tablet Take 6.25 mg by mouth 2 (two) times daily with a meal.    [provider]  clopidogrel (PLAVIX) 75 MG tablet Take 75 mg by mouth daily.    [provider]  levothyroxine (SYNTHROID, LEVOTHROID) 88 MCG tablet Take 88 mcg by mouth daily before breakfast.    [provider]  lisinopril (PRINIVIL,ZESTRIL) 40 MG tablet Take 40 mg by mouth daily.     [provider]    Allergies Metformin and related    Social History Social History   Tobacco Use  . Smoking status: Never Smoker  . Smokeless tobacco: Never Used  Substance Use Topics  . Alcohol use: No  . Drug use: No     Review of Systems Patient denies headaches, rhinorrhea, blurry vision, numbness, shortness of breath, chest pain, edema, cough, abdominal pain, nausea, vomiting, diarrhea, dysuria, fevers, rashes or hallucinations unless otherwise stated above in HPI. ____________________________________________   PHYSICAL EXAM:  VITAL SIGNS: Vitals:   08/07/18 1035 08/07/18 1038  BP:  (!) 138/55  Pulse:  71  Resp:  16  Temp:  97.6 F (36.4 C)  SpO2: 97% 100%    Constitutional: Alert, chronically disoriented Eyes: Conjunctivae are normal.  Head: Atraumatic. Nose: No congestion/rhinnorhea. Mouth/Throat: Mucous membranes are moist.   Neck: No stridor. Painless ROM.  Cardiovascular: Normal rate, regular rhythm. Grossly normal heart sounds.  Good peripheral circulation. Respiratory: Normal respiratory effort.  No retractions. Lungs CTAB. Gastrointestinal: Soft and nontender. No distention. No abdominal bruits. No CVA tenderness. Genitourinary:  Musculoskeletal: pain and small hematoma to left hip. No lower extremity tenderness nor edema.  No joint effusions. Neurologic:  Normal speech and language. No gross focal neurologic deficits are appreciated. No facial droop Skin:  Skin is warm, dry and intact. No rash noted. Psychiatric: pleasant and cooperative  ____________________________________________   LABS (all labs ordered are listed, but only abnormal results are displayed)  No results found for this or any previous visit (from the past 24 hour(s)). ____________________________________________ _____________________________  [id number]  I personally reviewed all radiographic images ordered  to evaluate for the above acute complaints and reviewed radiology reports and findings.  These findings were personally discussed with the patient.  Please see medical record for radiology report.  ____________________________________________   PROCEDURES  Procedure(s) performed:   Procedures    Critical Care performed: no ____________________________________________   INITIAL IMPRESSION / ASSESSMENT AND PLAN / ED COURSE  Pertinent labs & imaging results that were available during my care of the patient were reviewed by me and considered in my medical decision making (see chart for details).   DDX: sdh, sah, fracture, contusion,   Deborah Case is a 85 y.o. who presents to the ED with advanced dementia presenting from memory care after witnessed fall.  CT imaging of head will be ordered she has high risk for head injury.  Does have contusion posterior left frontal scalp no evidence of laceration.  Will also order radiographs of left hip.  Remainder of exam is benign.  Clinical Course as of Aug 07 1227  Nancy Fetter Aug 07, 2018  1228  CT and radiographs without evidence of fracture.  Patient remains hemodynamically stable.  Repeat exam is reassuring.  She is appropriate for outpatient follow-up   [PR]    Clinical Course User Index [PR] Merlyn Lot, MD     As part of my medical decision making, I reviewed the following data within the [place] notes reviewed and incorporated, Labs reviewed, notes from prior ED visits and [place] Controlled Substance Database   ____________________________________________   FINAL CLINICAL IMPRESSION(S) / ED DIAGNOSES  Final diagnoses:  Fall, initial encounter  Acute hip pain, left      NEW MEDICATIONS STARTED DURING THIS VISIT:  New Prescriptions   No medications on file     Note:  This document was prepared using Dragon voice recognition software and may include unintentional dictation errors.    Merlyn Lot, MD 08/07/18 [phone]

## 2018-08-13 ENCOUNTER — Emergency Department: Payer: Medicare Other

## 2018-08-13 ENCOUNTER — Emergency Department
Admission: EM | Admit: 2018-08-13 | Discharge: 2018-08-13 | Disposition: A | Discharge status: Skilled Nursing Facility | Payer: Medicare Other | Attending: Emergency Medicine | Admitting: Emergency Medicine

## 2018-08-13 ENCOUNTER — Other Ambulatory Visit: Payer: Self-pay

## 2018-08-13 DIAGNOSIS — S0181XA Laceration without foreign body of other part of head, initial encounter: Secondary | ICD-10-CM | POA: Insufficient documentation

## 2018-08-13 DIAGNOSIS — W01198A Fall on same level from slipping, tripping and stumbling with subsequent striking against other object, initial encounter: Secondary | ICD-10-CM | POA: Diagnosis not present

## 2018-08-13 DIAGNOSIS — Y999 Unspecified external cause status: Secondary | ICD-10-CM | POA: Diagnosis not present

## 2018-08-13 DIAGNOSIS — I509 Heart failure, unspecified: Secondary | ICD-10-CM | POA: Insufficient documentation

## 2018-08-13 DIAGNOSIS — S81812A Laceration without foreign body, left lower leg, initial encounter: Secondary | ICD-10-CM | POA: Diagnosis not present

## 2018-08-13 DIAGNOSIS — Z7902 Long term (current) use of antithrombotics/antiplatelets: Secondary | ICD-10-CM | POA: Insufficient documentation

## 2018-08-13 DIAGNOSIS — Y92128 Other place in nursing home as the place of occurrence of the external cause: Secondary | ICD-10-CM | POA: Diagnosis not present

## 2018-08-13 DIAGNOSIS — F0390 Unspecified dementia without behavioral disturbance: Secondary | ICD-10-CM | POA: Insufficient documentation

## 2018-08-13 DIAGNOSIS — Y939 Activity, unspecified: Secondary | ICD-10-CM | POA: Insufficient documentation

## 2018-08-13 DIAGNOSIS — E119 Type 2 diabetes mellitus without complications: Secondary | ICD-10-CM | POA: Diagnosis not present

## 2018-08-13 DIAGNOSIS — Z79899 Other long term (current) drug therapy: Secondary | ICD-10-CM | POA: Insufficient documentation

## 2018-08-13 DIAGNOSIS — I110 Hypertensive heart disease with heart failure: Secondary | ICD-10-CM | POA: Insufficient documentation

## 2018-08-13 DIAGNOSIS — S0101XA Laceration without foreign body of scalp, initial encounter: Secondary | ICD-10-CM

## 2018-08-13 DIAGNOSIS — S0990XA Unspecified injury of head, initial encounter: Secondary | ICD-10-CM | POA: Diagnosis present

## 2018-08-13 DIAGNOSIS — W19XXXA Unspecified fall, initial encounter: Secondary | ICD-10-CM

## 2018-08-13 MED ORDER — TETANUS-DIPHTH-ACELL PERTUSSIS 5-2.5-18.5 LF-MCG/0.5 IM SUSP
0.5000 mL | Freq: Once | INTRAMUSCULAR | Status: AC
  Administered 2018-08-13: 0.5 mL via INTRAMUSCULAR
  Filled 2018-08-13: qty 0.5

## 2018-08-13 MED ORDER — ACETAMINOPHEN 325 MG PO TABS
650.0000 mg | ORAL_TABLET | Freq: Once | ORAL | Status: AC
  Administered 2018-08-13: 650 mg via ORAL
  Filled 2018-08-13: qty 2

## 2018-08-13 MED ORDER — LIDOCAINE HCL (PF) 1 % IJ SOLN
5.0000 mL | Freq: Once | INTRAMUSCULAR | Status: DC
  Filled 2018-08-13: qty 5

## 2018-08-13 MED ORDER — BACITRACIN-NEOMYCIN-POLYMYXIN 400-5-5000 EX OINT
TOPICAL_OINTMENT | Freq: Every day | CUTANEOUS | Status: DC
  Filled 2018-08-13: qty 2

## 2018-08-13 NOTE — ED Provider Notes (Signed)
[facility] Emergency Department Provider Note  ____________________________________________  Time seen: Approximately 7:36 PM  I have reviewed the triage vital signs and the nursing notes.   HISTORY  Chief Complaint Fall    Level 5 Caveat: Portions of the History and Physical including HPI and review of systems are unable to be completely obtained due to patient being a poor historian   HPI Deborah Case is a 85 y.o. female with dementia diabetes hypertension who had a witnessed mechanical fall at her memory care nursing home today.  She tripped and fell over hitting her left side against the wall.  No loss of consciousness or neck pain.  No vomiting.  She complains of left knee pain and was reported to have a laceration there.  Also complains of left anterior chest pain.    Past Medical History:  Diagnosis Date  . Cancer ([place])    uterine  . CHF (congestive heart failure) ([place])   . Dementia ([place])   . Diabetes mellitus without complication ([place])   . Hypercholesteremia   . Hypertension   . Pulmonary embolism ([place])   . Thyroid disease      There are no active problems to display for this patient.    Past Surgical History:  Procedure Laterality Date  . ABDOMINAL HYSTERECTOMY       Prior to Admission medications   Medication Sig Start Date End Date Taking? Authorizing Provider  acetaminophen (TYLENOL) 500 MG tablet Take 500 mg by mouth every 4 (four) hours as needed for mild pain or moderate pain.   Yes [provider]  alum & mag hydroxide-simeth ([place]) 200-200-20 MG/5ML suspension Take 30 mLs by mouth every 6 (six) hours as needed for indigestion or heartburn.   Yes [provider]  atorvastatin (LIPITOR) 80 MG tablet Take 80 mg by mouth at bedtime.  06/16/18 06/17/19 Yes [provider]  carvedilol (COREG) 6.25 MG tablet Take 6.25 mg by mouth daily.    Yes [provider]  clopidogrel (PLAVIX) 75 MG tablet  Take 75 mg by mouth daily.   Yes [provider]  guaifenesin (ROBITUSSIN) 100 MG/5ML syrup Take 200 mg by mouth every 6 (six) hours as needed for cough.   Yes [provider]  levothyroxine (SYNTHROID, LEVOTHROID) 88 MCG tablet Take 88 mcg by mouth daily before breakfast.   Yes [provider]  lisinopril (PRINIVIL,ZESTRIL) 10 MG tablet Take 10 mg by mouth daily. 12/22/17 12/23/18 Yes [provider]  loperamide (IMODIUM) 2 MG capsule Take 2 mg by mouth as needed for diarrhea or loose stools. (max 8 doses in 24 hours)   Yes [provider]  magnesium hydroxide (MILK OF MAGNESIA) 400 MG/5ML suspension Take 30 mLs by mouth daily as needed for mild constipation.   Yes [provider]  neomycin-bacitracin-polymyxin (NEOSPORIN) ointment Apply 1 application topically as needed for wound care.   Yes [provider]     Allergies Metformin and related   History reviewed. No pertinent family history.  Social History Social History   Tobacco Use  . Smoking status: Never Smoker  . Smokeless tobacco: Never Used  Substance Use Topics  . Alcohol use: No  . Drug use: No    Review of Systems Level 5 Caveat: Portions of the History and Physical including HPI and review of systems are unable to be completely obtained due to patient being a poor historian   Constitutional:   No known fever.  ENT:  No rhinorrhea. Cardiovascular:   No chest pain or syncope. Respiratory:   No dyspnea or cough. Gastrointestinal:   Negative for abdominal pain, vomiting and diarrhea.  Musculoskeletal: Left forehead, left chest, left knee pain. ____________________________________________   PHYSICAL EXAM:  VITAL SIGNS: ED Triage Vitals  Enc Vitals Group     BP 08/13/18 1900 (!) 186/68     Pulse Rate 08/13/18 1736 74     Resp 08/13/18 1736 16     Temp 08/13/18 1736 98.2 F (36.8 C)     Temp Source 08/13/18 1736 Oral     SpO2 08/13/18 1736 100 %      Weight 08/13/18 1738 140 lb (63.5 kg)     Height 08/13/18 1738 5\' 2"  (1.575 m)     Head Circumference --      Peak Flow --      Pain Score 08/13/18 1921 0     Pain Loc --      Pain Edu? --      Excl. in [place]? --     Vital signs reviewed, nursing assessments reviewed.   Constitutional:   Alert and oriented to person and place. Non-toxic appearance. Eyes:   Conjunctivae are normal. EOMI. PERRL. ENT      Head:   Normocephalic with 2 cm curvilinear laceration over the left lateral forehead not involving the eyebrow.  Hemostatic.  No underlying fracture.      Nose:   No congestion/rhinnorhea.  No epistaxis or septal hematoma      Mouth/Throat:   MMM, no pharyngeal erythema. No peritonsillar mass.  No intraoral injury      Neck:   No meningismus. Full ROM.  Midline spinal tenderness Hematological/Lymphatic/Immunilogical:   No cervical lymphadenopathy. Cardiovascular:   RRR. Symmetric bilateral radial and DP pulses.  No murmurs. Cap refill less than 2 seconds. Respiratory:   Normal respiratory effort without tachypnea/retractions. Breath sounds are clear and equal bilaterally. No wheezes/rales/rhonchi. Gastrointestinal:   Soft and nontender. Non distended. There is no CVA tenderness.  No rebound, rigidity, or guarding.  Musculoskeletal:   Normal range of motion in all extremities. No joint effusions.  No lower extremity tenderness.  No edema.  Chest wall stable with tenderness on the left anterior chest over the third and fourth ribs reproducing her pain. Neurologic:   Normal speech and language.  Motor grossly intact. No acute focal neurologic deficits are appreciated.  Skin:    Skin is warm, dry with skin tear overlying the patellar surface of the left knee, dermis intact, hemostatic.  No inflammatory changes.. No rash noted.  No petechiae, purpura, or bullae.  ____________________________________________    LABS (pertinent positives/negatives) (all labs ordered are listed, but only  abnormal results are displayed) Labs Reviewed - No data to display ____________________________________________   EKG    ____________________________________________    RADIOLOGY  Dg Chest 1 View  Result Date: 08/13/2018 CLINICAL DATA:  Patient status post fall.  Left-sided pain. EXAM: CHEST  1 VIEW COMPARISON:  Chest radiograph 07/13/2018 FINDINGS: Normal cardiac and mediastinal contours. Aortic atherosclerosis. No consolidative pulmonary opacities. No pleural effusion or pneumothorax. IMPRESSION: No acute cardiopulmonary process.  Aortic atherosclerosis. Electronically Signed   By: Lovey Newcomer M.D.   On: 08/13/2018 18:20   Ct Head Wo Contrast  Addendum Date: 08/13/2018   ADDENDUM REPORT: 08/13/2018 18:44 ADDENDUM: Also noted is chronic right maxillary sinusitis with retained secretions. Electronically Signed   By: Claudie Revering M.D.   On: 08/13/2018 18:44   Result Date:  08/13/2018 CLINICAL DATA:  Left periorbital laceration following a fall. History of dementia. EXAM: CT HEAD WITHOUT CONTRAST CT CERVICAL SPINE WITHOUT CONTRAST TECHNIQUE: Multidetector CT imaging of the head and cervical spine was performed following the standard protocol without intravenous contrast. Multiplanar CT image reconstructions of the cervical spine were also generated. COMPARISON:  Head CT dated 08/07/2018. FINDINGS: CT HEAD FINDINGS Brain: No significant change in mild-to-moderate enlargement of the ventricles and subarachnoid spaces. Mild-to-moderate patchy white matter low density in both cerebral hemispheres without significant change. No intracranial hemorrhage, mass lesion or CT evidence of acute infarction. Vascular: No hyperdense vessel or unexpected calcification. Skull: Normal. Negative for fracture or focal lesion. Sinuses/Orbits: Status post bilateral cataract extraction. Right maxillary sinus mucosal thickening and retained secretions. Other: Left lateral periorbital laceration. CT CERVICAL SPINE  FINDINGS Alignment: Mild reversal of the normal cervical lordosis. Mild to moderate levoconvex cervicothoracic scoliosis. Minimal retrolisthesis at the C6-7 level. Skull base and vertebrae: No acute fracture. No primary bone lesion or focal pathologic process. Soft tissues and spinal canal: No prevertebral fluid or swelling. No visible canal hematoma. Disc levels: Moderate to marked disc space narrowing at the C6-7 level with moderate anterior and minimal posterior spur formation with mild posterior disc protrusions and posterior longitudinal ligament ossification at the C3-4, C4-5, C5-6 and C6-7 levels. Mild facet degenerative changes on the left at the C6-7 and C7-T1 levels. Upper chest: Mild right apical pleural and parenchymal scar formation. Other: Bilateral carotid artery calcifications. IMPRESSION: 1. Left lateral periorbital laceration without skull fracture or intracranial hemorrhage. 2. No cervical spine fracture or traumatic subluxation. 3. Stable mild to moderate diffuse cerebral and cerebellar atrophy and mild-to-moderate chronic small vessel white matter ischemic changes in both cerebral hemispheres. 4. Cervical spine degenerative changes and mild reversal of the normal cervical lordosis. 5. Bilateral carotid artery atheromatous calcifications. Electronically Signed: By: Claudie Revering M.D. On: 08/13/2018 18:22   Ct Cervical Spine Wo Contrast  Addendum Date: 08/13/2018   ADDENDUM REPORT: 08/13/2018 18:44 ADDENDUM: Also noted is chronic right maxillary sinusitis with retained secretions. Electronically Signed   By: Claudie Revering M.D.   On: 08/13/2018 18:44   Result Date: 08/13/2018 CLINICAL DATA:  Left periorbital laceration following a fall. History of dementia. EXAM: CT HEAD WITHOUT CONTRAST CT CERVICAL SPINE WITHOUT CONTRAST TECHNIQUE: Multidetector CT imaging of the head and cervical spine was performed following the standard protocol without intravenous contrast. Multiplanar CT image  reconstructions of the cervical spine were also generated. COMPARISON:  Head CT dated 08/07/2018. FINDINGS: CT HEAD FINDINGS Brain: No significant change in mild-to-moderate enlargement of the ventricles and subarachnoid spaces. Mild-to-moderate patchy white matter low density in both cerebral hemispheres without significant change. No intracranial hemorrhage, mass lesion or CT evidence of acute infarction. Vascular: No hyperdense vessel or unexpected calcification. Skull: Normal. Negative for fracture or focal lesion. Sinuses/Orbits: Status post bilateral cataract extraction. Right maxillary sinus mucosal thickening and retained secretions. Other: Left lateral periorbital laceration. CT CERVICAL SPINE FINDINGS Alignment: Mild reversal of the normal cervical lordosis. Mild to moderate levoconvex cervicothoracic scoliosis. Minimal retrolisthesis at the C6-7 level. Skull base and vertebrae: No acute fracture. No primary bone lesion or focal pathologic process. Soft tissues and spinal canal: No prevertebral fluid or swelling. No visible canal hematoma. Disc levels: Moderate to marked disc space narrowing at the C6-7 level with moderate anterior and minimal posterior spur formation with mild posterior disc protrusions and posterior longitudinal ligament ossification at the C3-4, C4-5, C5-6 and C6-7 levels. Mild facet  degenerative changes on the left at the C6-7 and C7-T1 levels. Upper chest: Mild right apical pleural and parenchymal scar formation. Other: Bilateral carotid artery calcifications. IMPRESSION: 1. Left lateral periorbital laceration without skull fracture or intracranial hemorrhage. 2. No cervical spine fracture or traumatic subluxation. 3. Stable mild to moderate diffuse cerebral and cerebellar atrophy and mild-to-moderate chronic small vessel white matter ischemic changes in both cerebral hemispheres. 4. Cervical spine degenerative changes and mild reversal of the normal cervical lordosis. 5. Bilateral  carotid artery atheromatous calcifications. Electronically Signed: By: Claudie Revering M.D. On: 08/13/2018 18:22   Dg Knee Complete 4 Views Left  Result Date: 08/13/2018 CLINICAL DATA:  Patient status post fall. Laceration to the anterior knee. EXAM: LEFT KNEE - COMPLETE 4+ VIEW COMPARISON:  None. FINDINGS: Normal anatomic alignment. No evidence for acute fracture or dislocation. Vascular calcifications. Chondrocalcinosis. No joint effusion. IMPRESSION: No acute osseous abnormality. Electronically Signed   By: Lovey Newcomer M.D.   On: 08/13/2018 18:22    ____________________________________________   PROCEDURES .Marland KitchenLaceration Repair Date/Time: 08/13/2018 7:39 PM Performed by: Carrie Mew, MD Authorized by: Carrie Mew, MD   Consent:    Consent obtained:  Verbal   Consent given by:  Patient   Risks discussed:  Infection, pain, retained foreign body, poor cosmetic result and poor wound healing   Alternatives discussed:  No treatment and referral Anesthesia (see MAR for exact dosages):    Anesthesia method:  None Laceration details:    Location:  Face   Face location:  Forehead   Length (cm):  2 Repair type:    Repair type:  Simple Pre-procedure details:    Preparation:  Patient was prepped and draped in usual sterile fashion Exploration:    Hemostasis achieved with:  Direct pressure   Wound exploration: entire depth of wound probed and visualized     Wound extent: no foreign bodies/material noted, no muscle damage noted and no vascular damage noted     Contaminated: no   Treatment:    Area cleansed with:  Saline and Betadine   Amount of cleaning:  Extensive   Irrigation solution:  Sterile saline   Visualized foreign bodies/material removed: no   Skin repair:    Repair method:  Tissue adhesive Approximation:    Approximation:  Close Post-procedure details:    Dressing:  Open (no dressing)   Patient tolerance of procedure:  Tolerated well, no immediate  complications    ____________________________________________  DIFFERENTIAL DIAGNOSIS   Subdural hematoma, epidural hematoma, C-spine fracture, rib fracture, knee fracture  CLINICAL IMPRESSION / ASSESSMENT AND PLAN / ED COURSE  Pertinent labs & imaging results that were available during my care of the patient were reviewed by me and considered in my medical decision making (see chart for details).    Patient presents after mechanical fall with blunt trauma to the left knee, left chest, left forehead.  She has a laceration which was repaired after CT imaging obtained to rule out life-threatening condition.  Chest x-ray and the x-ray are unremarkable and can be treated conservatively.      ____________________________________________   FINAL CLINICAL IMPRESSION(S) / ED DIAGNOSES    Final diagnoses:  Fall, initial encounter  Laceration of scalp, initial encounter  Noninfected skin tear of leg, left, initial encounter     ED Discharge Orders    None      Portions of this note were generated with dragon dictation software. Dictation errors may occur despite best attempts at proofreading.   Carrie Mew,  MD 08/13/18 1940

## 2018-08-13 NOTE — ED Triage Notes (Signed)
Pt arrived via EMS from [facility] d/t a witnessed mechanical fall. EMS reports there was no LOC and hx of dementia. Pt has laceration to her left knee and left eye. Pt c/o left sided rib pain as well.

## 2018-08-13 NOTE — ED Notes (Signed)
Patient laying in bed resting. Will continue to monitor.

## 2018-08-30 ENCOUNTER — Encounter: Payer: Self-pay | Admitting: Emergency Medicine

## 2018-08-30 ENCOUNTER — Emergency Department: Payer: Medicare Other

## 2018-08-30 ENCOUNTER — Other Ambulatory Visit: Payer: Self-pay

## 2018-08-30 ENCOUNTER — Emergency Department
Admission: EM | Admit: 2018-08-30 | Discharge: 2018-08-30 | Disposition: A | Discharge status: Home / Self Care | Payer: Medicare Other | Attending: Emergency Medicine | Admitting: Emergency Medicine

## 2018-08-30 DIAGNOSIS — F0390 Unspecified dementia without behavioral disturbance: Secondary | ICD-10-CM | POA: Diagnosis not present

## 2018-08-30 DIAGNOSIS — E079 Disorder of thyroid, unspecified: Secondary | ICD-10-CM | POA: Insufficient documentation

## 2018-08-30 DIAGNOSIS — S81812A Laceration without foreign body, left lower leg, initial encounter: Secondary | ICD-10-CM | POA: Insufficient documentation

## 2018-08-30 DIAGNOSIS — Z8542 Personal history of malignant neoplasm of other parts of uterus: Secondary | ICD-10-CM | POA: Insufficient documentation

## 2018-08-30 DIAGNOSIS — E119 Type 2 diabetes mellitus without complications: Secondary | ICD-10-CM | POA: Diagnosis not present

## 2018-08-30 DIAGNOSIS — I509 Heart failure, unspecified: Secondary | ICD-10-CM | POA: Insufficient documentation

## 2018-08-30 DIAGNOSIS — I110 Hypertensive heart disease with heart failure: Secondary | ICD-10-CM | POA: Insufficient documentation

## 2018-08-30 DIAGNOSIS — Z79899 Other long term (current) drug therapy: Secondary | ICD-10-CM | POA: Diagnosis not present

## 2018-08-30 DIAGNOSIS — S0990XA Unspecified injury of head, initial encounter: Secondary | ICD-10-CM | POA: Diagnosis present

## 2018-08-30 DIAGNOSIS — Y92128 Other place in nursing home as the place of occurrence of the external cause: Secondary | ICD-10-CM | POA: Insufficient documentation

## 2018-08-30 DIAGNOSIS — W010XXA Fall on same level from slipping, tripping and stumbling without subsequent striking against object, initial encounter: Secondary | ICD-10-CM | POA: Diagnosis not present

## 2018-08-30 DIAGNOSIS — W19XXXA Unspecified fall, initial encounter: Secondary | ICD-10-CM

## 2018-08-30 DIAGNOSIS — S0093XA Contusion of unspecified part of head, initial encounter: Secondary | ICD-10-CM | POA: Diagnosis not present

## 2018-08-30 DIAGNOSIS — Y998 Other external cause status: Secondary | ICD-10-CM | POA: Diagnosis not present

## 2018-08-30 DIAGNOSIS — Y9301 Activity, walking, marching and hiking: Secondary | ICD-10-CM | POA: Insufficient documentation

## 2018-08-30 NOTE — Discharge Instructions (Addendum)
Ms. Karg imaging did not show any fracture or concerning traumatic findings today. Her imaging did show that she might be a higher risk of aspiration. Please pay special attention to any chocking episodes. Please discuss with her primary care physician.

## 2018-08-30 NOTE — ED Notes (Signed)
[facility] called for transport to [facility]  

## 2018-08-30 NOTE — ED Provider Notes (Signed)
[facility] Emergency Department Provider Note    ____________________________________________   I have reviewed the triage vital signs and the nursing notes.   HISTORY  Chief Complaint Fall   History limited by: Alzheimers  HPI Deborah Case is a 85 y.o. female who presents to the emergency department today from living facility after a witnessed fall. The patient was apparently walking into the dining area when she tripped and fell. The patient is complaining of pain to her left knee and EMS did notice and wrap a skin tear to her knee. She also states she hit her head although denies any significant headache. She denies any recent illness, fevers, chest pain or shortness of breath.    Records reviewed. Per medical record review patient has a history of Dementia  Past Medical History:  Diagnosis Date  . Cancer ([place])    uterine  . CHF (congestive heart failure) ([place])   . Dementia ([place])   . Diabetes mellitus without complication ([place])   . Hypercholesteremia   . Hypertension   . Pulmonary embolism ([place])   . Thyroid disease     There are no active problems to display for this patient.   Past Surgical History:  Procedure Laterality Date  . ABDOMINAL HYSTERECTOMY      Prior to Admission medications   Medication Sig Start Date End Date Taking? Authorizing Provider  acetaminophen (TYLENOL) 500 MG tablet Take 500 mg by mouth every 4 (four) hours as needed for mild pain or moderate pain.    [provider]  alum & mag hydroxide-simeth ([place]) 200-200-20 MG/5ML suspension Take 30 mLs by mouth every 6 (six) hours as needed for indigestion or heartburn.    [provider]  atorvastatin (LIPITOR) 80 MG tablet Take 80 mg by mouth at bedtime.  06/16/18 06/17/19  [provider]  carvedilol (COREG) 6.25 MG tablet Take 6.25 mg by mouth daily.     [provider]  clopidogrel (PLAVIX) 75 MG tablet Take 75 mg by mouth daily.     [provider]  guaifenesin (ROBITUSSIN) 100 MG/5ML syrup Take 200 mg by mouth every 6 (six) hours as needed for cough.    [provider]  levothyroxine (SYNTHROID, LEVOTHROID) 88 MCG tablet Take 88 mcg by mouth daily before breakfast.    [provider]  lisinopril (PRINIVIL,ZESTRIL) 10 MG tablet Take 10 mg by mouth daily. 12/22/17 12/23/18  [provider]  loperamide (IMODIUM) 2 MG capsule Take 2 mg by mouth as needed for diarrhea or loose stools. (max 8 doses in 24 hours)    [provider]  magnesium hydroxide (MILK OF MAGNESIA) 400 MG/5ML suspension Take 30 mLs by mouth daily as needed for mild constipation.    [provider]  neomycin-bacitracin-polymyxin (NEOSPORIN) ointment Apply 1 application topically as needed for wound care.    [provider]    Allergies Metformin and related  No family history on file.  Social History Social History   Tobacco Use  . Smoking status: Never Smoker  . Smokeless tobacco: Never Used  Substance Use Topics  . Alcohol use: No  . Drug use: No    Review of Systems Constitutional: No fever/chills Eyes: No visual changes. ENT: No sore throat. Cardiovascular: Denies chest pain. Respiratory: Denies shortness of breath. Gastrointestinal: No abdominal pain.  No nausea, no vomiting.  No diarrhea.   Genitourinary: Negative for dysuria. Musculoskeletal: Positive for left knee pain.  Skin: Positive for cut to left knee  Neurological: Negative for headaches, focal weakness or numbness.  ____________________________________________   PHYSICAL EXAM:  VITAL SIGNS: ED Triage Vitals  Enc Vitals Group     BP 08/30/18 1838 (!) 180/61     Pulse Rate 08/30/18 1838 71     Resp 08/30/18 1838 16     Temp 08/30/18 1838 98 F (36.7 C)     Temp Source 08/30/18 1838 Oral     SpO2 08/30/18 1838 100 %     Weight 08/30/18 1833 140 lb (63.5 kg)     Height 08/30/18 1833 5\' 2"  (1.575 m)     Constitutional: Awake and alert. Not completely oriented to events.  Eyes: Conjunctivae are normal.  ENT      Head: Normocephalic. Small hematoma noted over left eyebrow.       Nose: No congestion/rhinnorhea.      Mouth/Throat: Mucous membranes are moist.      Neck: No stridor. Hematological/Lymphatic/Immunilogical: No cervical lymphadenopathy. Cardiovascular: Normal rate, regular rhythm.  No murmurs, rubs, or gallops. Respiratory: Normal respiratory effort without tachypnea nor retractions. Breath sounds are clear and equal bilaterally. No wheezes/rales/rhonchi. Gastrointestinal: Soft and non tender. No rebound. No guarding.  Genitourinary: Deferred Musculoskeletal: Normal range of motion in all extremities. Tender to palpation and manipulation of left knee. No deformity appreciated.  Neurologic:  Normal speech and language. No gross focal neurologic deficits are appreciated.  Skin:  Skin tear noted over left patella.  ___________________________________________    LABS (pertinent positives/negatives)  None  ____________________________________________   EKG  None  ____________________________________________    RADIOLOGY  Left knee No acute fracture/dislocation  CT head/cervical spine No acute fracture, intracranial bleed  ____________________________________________   PROCEDURES  Procedures  ____________________________________________   INITIAL IMPRESSION / ASSESSMENT AND PLAN / ED COURSE  Pertinent labs & imaging results that were available during my care of the patient were reviewed by me and considered in my medical decision making (see chart for details).   Patient presented to the emergency department today after a witnessed mechanical fall from living facility.  On exam only traumatic findings are for hematoma over her left eyebrow as well as a skin tear over her left knee.  X-rays and CT scans without any concerning acute findings.  Will plan on  discharging back to the facility. ____________________________________________   FINAL CLINICAL IMPRESSION(S) / ED DIAGNOSES  Final diagnoses:  Fall, initial encounter  Contusion of head, unspecified part of head, initial encounter  Skin tear of left lower leg without complication, initial encounter     Note: This dictation was prepared with Dragon dictation. Any transcriptional errors that result from this process are unintentional     Nance Pear, MD 08/30/18 [phone]

## 2018-08-30 NOTE — ED Triage Notes (Signed)
Arrives from [facility] via [facility].  Per EMS, staff report patient was walking in dining area and fell, tripped.  Patient has history of alzheimers and is on the memory care unit.

## 2018-08-30 NOTE — ED Notes (Signed)
Called and spoke with staff at [facility] and informed them that pt was up for discharge and gave discharge instructions.

## 2018-08-30 NOTE — ED Notes (Signed)
Patient transported to CT 

## 2018-09-05 ENCOUNTER — Emergency Department: Payer: Medicare Other

## 2018-09-05 ENCOUNTER — Encounter: Payer: Self-pay | Admitting: Emergency Medicine

## 2018-09-05 ENCOUNTER — Other Ambulatory Visit: Payer: Self-pay

## 2018-09-05 ENCOUNTER — Inpatient Hospital Stay
Admission: EM | Admit: 2018-09-05 | Discharge: 2018-09-09 | DRG: 684 | Disposition: A | Discharge status: Nursing Home (Includes ICF) | Payer: Medicare Other | Attending: Family Medicine | Admitting: Family Medicine

## 2018-09-05 DIAGNOSIS — W19XXXA Unspecified fall, initial encounter: Secondary | ICD-10-CM

## 2018-09-05 DIAGNOSIS — Z79890 Hormone replacement therapy (postmenopausal): Secondary | ICD-10-CM

## 2018-09-05 DIAGNOSIS — Z86711 Personal history of pulmonary embolism: Secondary | ICD-10-CM

## 2018-09-05 DIAGNOSIS — I110 Hypertensive heart disease with heart failure: Secondary | ICD-10-CM | POA: Diagnosis present

## 2018-09-05 DIAGNOSIS — Z7902 Long term (current) use of antithrombotics/antiplatelets: Secondary | ICD-10-CM | POA: Diagnosis not present

## 2018-09-05 DIAGNOSIS — T796XXA Traumatic ischemia of muscle, initial encounter: Secondary | ICD-10-CM | POA: Diagnosis present

## 2018-09-05 DIAGNOSIS — N179 Acute kidney failure, unspecified: Principal | ICD-10-CM | POA: Diagnosis present

## 2018-09-05 DIAGNOSIS — I509 Heart failure, unspecified: Secondary | ICD-10-CM | POA: Diagnosis present

## 2018-09-05 DIAGNOSIS — W1830XA Fall on same level, unspecified, initial encounter: Secondary | ICD-10-CM | POA: Diagnosis present

## 2018-09-05 DIAGNOSIS — Z79899 Other long term (current) drug therapy: Secondary | ICD-10-CM | POA: Diagnosis not present

## 2018-09-05 DIAGNOSIS — Y92099 Unspecified place in other non-institutional residence as the place of occurrence of the external cause: Secondary | ICD-10-CM

## 2018-09-05 DIAGNOSIS — E785 Hyperlipidemia, unspecified: Secondary | ICD-10-CM | POA: Diagnosis present

## 2018-09-05 DIAGNOSIS — F0390 Unspecified dementia without behavioral disturbance: Secondary | ICD-10-CM | POA: Diagnosis present

## 2018-09-05 DIAGNOSIS — R52 Pain, unspecified: Secondary | ICD-10-CM

## 2018-09-05 DIAGNOSIS — R531 Weakness: Secondary | ICD-10-CM | POA: Diagnosis present

## 2018-09-05 DIAGNOSIS — Z888 Allergy status to other drugs, medicaments and biological substances status: Secondary | ICD-10-CM | POA: Diagnosis not present

## 2018-09-05 DIAGNOSIS — E875 Hyperkalemia: Secondary | ICD-10-CM

## 2018-09-05 DIAGNOSIS — Z90710 Acquired absence of both cervix and uterus: Secondary | ICD-10-CM | POA: Diagnosis not present

## 2018-09-05 DIAGNOSIS — R710 Precipitous drop in hematocrit: Secondary | ICD-10-CM

## 2018-09-05 DIAGNOSIS — E1165 Type 2 diabetes mellitus with hyperglycemia: Secondary | ICD-10-CM | POA: Diagnosis present

## 2018-09-05 DIAGNOSIS — E860 Dehydration: Secondary | ICD-10-CM | POA: Diagnosis present

## 2018-09-05 LAB — CK: Total CK: 1160 U/L — ABNORMAL HIGH (ref 38–234)

## 2018-09-05 LAB — CBC WITH DIFFERENTIAL/PLATELET
Abs Immature Granulocytes: 0.04 10*3/uL (ref 0.00–0.07)
Basophils Absolute: 0 10*3/uL (ref 0.0–0.1)
Basophils Relative: 0 %
Eosinophils Absolute: 0 10*3/uL (ref 0.0–0.5)
Eosinophils Relative: 0 %
HCT: 28.6 % — ABNORMAL LOW (ref 36.0–46.0)
Hemoglobin: 8.9 g/dL — ABNORMAL LOW (ref 12.0–15.0)
Immature Granulocytes: 1 %
Lymphocytes Relative: 14 %
Lymphs Abs: 0.9 10*3/uL (ref 0.7–4.0)
MCH: 29.2 pg (ref 26.0–34.0)
MCHC: 31.1 g/dL (ref 30.0–36.0)
MCV: 93.8 fL (ref 80.0–100.0)
Monocytes Absolute: 0.4 10*3/uL (ref 0.1–1.0)
Monocytes Relative: 6 %
Neutro Abs: 5.1 10*3/uL (ref 1.7–7.7)
Neutrophils Relative %: 79 %
Platelets: 125 10*3/uL — ABNORMAL LOW (ref 150–400)
RBC: 3.05 MIL/uL — ABNORMAL LOW (ref 3.87–5.11)
RDW: 15.7 % — ABNORMAL HIGH (ref 11.5–15.5)
WBC: 6.4 10*3/uL (ref 4.0–10.5)
nRBC: 0 % (ref 0.0–0.2)

## 2018-09-05 LAB — URINALYSIS, COMPLETE (UACMP) WITH MICROSCOPIC
Bilirubin Urine: NEGATIVE
Glucose, UA: NEGATIVE mg/dL
Hgb urine dipstick: NEGATIVE
Ketones, ur: NEGATIVE mg/dL
Nitrite: POSITIVE — AB
Protein, ur: NEGATIVE mg/dL
Specific Gravity, Urine: 1.025 (ref 1.005–1.030)
WBC, UA: >50 WBC/hpf — ABNORMAL HIGH (ref 0–5)
pH: 6 (ref 5.0–8.0)

## 2018-09-05 LAB — COMPREHENSIVE METABOLIC PANEL
ALT: 26 U/L (ref 0–44)
AST: 35 U/L (ref 15–41)
Albumin: 3.5 g/dL (ref 3.5–5.0)
Alkaline Phosphatase: 107 U/L (ref 38–126)
Anion gap: 7 (ref 5–15)
BUN: 50 mg/dL — ABNORMAL HIGH (ref 8–23)
CO2: 25 mmol/L (ref 22–32)
Calcium: 8.8 mg/dL — ABNORMAL LOW (ref 8.9–10.3)
Chloride: 105 mmol/L (ref 98–111)
Creatinine, Ser: 1.31 mg/dL — ABNORMAL HIGH (ref 0.44–1.00)
GFR calc Af Amer: 43 mL/min — ABNORMAL LOW (ref >60)
GFR calc non Af Amer: 37 mL/min — ABNORMAL LOW (ref >60)
Glucose, Bld: 208 mg/dL — ABNORMAL HIGH (ref 70–99)
Potassium: 5.7 mmol/L — ABNORMAL HIGH (ref 3.5–5.1)
Sodium: 137 mmol/L (ref 135–145)
Total Bilirubin: 0.7 mg/dL (ref 0.3–1.2)
Total Protein: 6.2 g/dL — ABNORMAL LOW (ref 6.5–8.1)

## 2018-09-05 LAB — TROPONIN I: Troponin I: <0.03 ng/mL (ref <0.03)

## 2018-09-05 LAB — MRSA PCR SCREENING: MRSA by PCR: NEGATIVE

## 2018-09-05 MED ORDER — GUAIFENESIN 100 MG/5ML PO SOLN
200.0000 mg | Freq: Four times a day (QID) | ORAL | Status: DC | PRN
  Filled 2018-09-05: qty 10

## 2018-09-05 MED ORDER — ACETAMINOPHEN 325 MG PO TABS
650.0000 mg | ORAL_TABLET | Freq: Four times a day (QID) | ORAL | Status: DC | PRN
  Administered 2018-09-06 – 2018-09-07 (×3): 650 mg via ORAL
  Filled 2018-09-05 (×3): qty 2

## 2018-09-05 MED ORDER — LEVOTHYROXINE SODIUM 88 MCG PO TABS
88.0000 ug | ORAL_TABLET | Freq: Every day | ORAL | Status: DC
  Administered 2018-09-06 – 2018-09-09 (×4): 88 ug via ORAL
  Filled 2018-09-05 (×4): qty 1

## 2018-09-05 MED ORDER — ATORVASTATIN CALCIUM 20 MG PO TABS
80.0000 mg | ORAL_TABLET | Freq: Every day | ORAL | Status: DC
  Administered 2018-09-05 – 2018-09-06 (×2): 80 mg via ORAL
  Filled 2018-09-05 (×2): qty 4

## 2018-09-05 MED ORDER — IOHEXOL 300 MG/ML  SOLN
75.0000 mL | Freq: Once | INTRAMUSCULAR | Status: AC | PRN
  Administered 2018-09-05: 15:00:00 75 mL via INTRAVENOUS
  Filled 2018-09-05: qty 75

## 2018-09-05 MED ORDER — ONDANSETRON HCL 4 MG/2ML IJ SOLN: 4.0000 mg | Freq: Four times a day (QID) | INTRAMUSCULAR | Status: DC | PRN

## 2018-09-05 MED ORDER — SENNOSIDES-DOCUSATE SODIUM 8.6-50 MG PO TABS: 1.0000 | ORAL_TABLET | Freq: Every evening | ORAL | Status: DC | PRN

## 2018-09-05 MED ORDER — ONDANSETRON HCL 4 MG PO TABS: 4.0000 mg | ORAL_TABLET | Freq: Four times a day (QID) | ORAL | Status: DC | PRN

## 2018-09-05 MED ORDER — SODIUM CHLORIDE 0.9 % IV SOLN
INTRAVENOUS | Status: DC
  Administered 2018-09-05 – 2018-09-06 (×3): via INTRAVENOUS

## 2018-09-05 MED ORDER — SODIUM POLYSTYRENE SULFONATE 15 GM/60ML PO SUSP
15.0000 g | Freq: Once | ORAL | Status: AC
  Administered 2018-09-05: 15 g via ORAL
  Filled 2018-09-05: qty 60

## 2018-09-05 MED ORDER — HEPARIN SODIUM (PORCINE) 5000 UNIT/ML IJ SOLN
5000.0000 [IU] | Freq: Three times a day (TID) | INTRAMUSCULAR | Status: DC
  Administered 2018-09-05 – 2018-09-09 (×11): 5000 [IU] via SUBCUTANEOUS
  Filled 2018-09-05 (×11): qty 1

## 2018-09-05 MED ORDER — CLOPIDOGREL BISULFATE 75 MG PO TABS
75.0000 mg | ORAL_TABLET | Freq: Every day | ORAL | Status: DC
  Administered 2018-09-06 – 2018-09-09 (×4): 75 mg via ORAL
  Filled 2018-09-05 (×4): qty 1

## 2018-09-05 MED ORDER — ACETAMINOPHEN 650 MG RE SUPP: 650.0000 mg | Freq: Four times a day (QID) | RECTAL | Status: DC | PRN

## 2018-09-05 MED ORDER — CARVEDILOL 3.125 MG PO TABS: 6.2500 mg | ORAL_TABLET | Freq: Every day | ORAL | Status: DC

## 2018-09-05 MED ORDER — LISINOPRIL 10 MG PO TABS
10.0000 mg | ORAL_TABLET | Freq: Every day | ORAL | Status: DC
  Administered 2018-09-06 – 2018-09-08 (×3): 10 mg via ORAL
  Filled 2018-09-05 (×4): qty 1

## 2018-09-05 NOTE — ED Provider Notes (Addendum)
[facility] Emergency Department Provider Note  ____________________________________________   First MD Initiated Contact with Patient 09/05/18 1440     (approximate)  I have reviewed the triage vital signs and the nursing notes.   HISTORY  Chief Complaint Fall    HPI Deborah Case is a 85 y.o. female who arrives via EMS from [place].  This is her second fall within a week.  She is complaining of pain to the right rib area and left elbow.  Due to her dementia I am unable to get a history of how she fell.  Unsure if she hit her head or loss consciousness. Pt has several medical problems which include htn, dm, and chf among others. See below.   Past Medical History:  Diagnosis Date  . Cancer ([place])    uterine  . CHF (congestive heart failure) ([place])   . Dementia ([place])   . Diabetes mellitus without complication ([place])   . Hypercholesteremia   . Hypertension   . Pulmonary embolism ([place])   . Thyroid disease     Patient Active Problem List   Diagnosis Date Noted  . AKI (acute kidney injury) ([place]) 09/05/2018    Past Surgical History:  Procedure Laterality Date  . ABDOMINAL HYSTERECTOMY      Prior to Admission medications   Medication Sig Start Date End Date Taking? Authorizing Provider  acetaminophen (TYLENOL) 500 MG tablet Take 500 mg by mouth every 4 (four) hours as needed for mild pain or moderate pain.    [provider]  alum & mag hydroxide-simeth ([place]) 200-200-20 MG/5ML suspension Take 30 mLs by mouth every 6 (six) hours as needed for indigestion or heartburn.    [provider]  atorvastatin (LIPITOR) 80 MG tablet Take 80 mg by mouth at bedtime.  06/16/18 06/17/19  [provider]  carvedilol (COREG) 6.25 MG tablet Take 6.25 mg by mouth daily.     [provider]  clopidogrel (PLAVIX) 75 MG tablet Take 75 mg by mouth daily.    [provider]  guaifenesin (ROBITUSSIN) 100 MG/5ML syrup Take  200 mg by mouth every 6 (six) hours as needed for cough.    [provider]  levothyroxine (SYNTHROID, LEVOTHROID) 88 MCG tablet Take 88 mcg by mouth daily before breakfast.    [provider]  lisinopril (PRINIVIL,ZESTRIL) 10 MG tablet Take 10 mg by mouth daily. 12/22/17 12/23/18  [provider]  loperamide (IMODIUM) 2 MG capsule Take 2 mg by mouth as needed for diarrhea or loose stools. (max 8 doses in 24 hours)    [provider]  magnesium hydroxide (MILK OF MAGNESIA) 400 MG/5ML suspension Take 30 mLs by mouth daily as needed for mild constipation.    [provider]  neomycin-bacitracin-polymyxin (NEOSPORIN) ointment Apply 1 application topically as needed for wound care.    [provider]    Allergies Metformin and related  No family history on file.  Social History Social History   Tobacco Use  . Smoking status: Never Smoker  . Smokeless tobacco: Never Used  Substance Use Topics  . Alcohol use: No  . Drug use: No    Review of Systems  Constitutional: No fever/chills, positive for fall Eyes: No visual changes. ENT: No sore throat. Respiratory: Denies cough Genitourinary: Negative for dysuria. Musculoskeletal: Negative for back pain.  Positive for right rib pain positive for left arm pain Skin: Negative for rash.    ____________________________________________   PHYSICAL EXAM:  VITAL  SIGNS: ED Triage Vitals  Enc Vitals Group     BP 09/05/18 1403 116/78     Pulse Rate 09/05/18 1403 61     Resp 09/05/18 1403 16     Temp 09/05/18 1403 98 F (36.7 C)     Temp Source 09/05/18 1403 Oral     SpO2 09/05/18 1403 98 %     Weight 09/05/18 1401 138 lb 14.2 oz (63 kg)     Height 09/05/18 1401 5\' 2"  (1.575 m)     Head Circumference --      Peak Flow --      Pain Score 09/05/18 1400 6     Pain Loc --      Pain Edu? --      Excl. in [place]? --     Constitutional:  no acute distress.  Patient has dementia Eyes:  Conjunctivae are normal.  Head: Atraumatic. Nose: No congestion/rhinnorhea. Mouth/Throat: Mucous membranes are moist.   Neck:  supple no lymphadenopathy noted Cardiovascular: Normal rate, regular rhythm. Respiratory: Normal respiratory effort.  No retractions Abd: soft tender in the right upper quadrant Bs normal all 4 quad GU: deferred Musculoskeletal: Right ribs are tender, left elbow is tender, legs appear to be nontender  neurologic: Patient is able to ask questions but due to the dementia I have not gotten a reasonable answer to my questions Skin:  Skin is warm, dry and intact. No rash noted. Psychiatric: Mood and affect are normal. ____________________________________________   LABS (all labs ordered are listed, but only abnormal results are displayed)  Labs Reviewed  CBC WITH DIFFERENTIAL/PLATELET - Abnormal; Notable for the following components:      Result Value   RBC 3.05 (*)    Hemoglobin 8.9 (*)    HCT 28.6 (*)    RDW 15.7 (*)    Platelets 125 (*)    All other components within normal limits  COMPREHENSIVE METABOLIC PANEL - Abnormal; Notable for the following components:   Potassium 5.7 (*)    Glucose, Bld 208 (*)    BUN 50 (*)    Creatinine, Ser 1.31 (*)    Calcium 8.8 (*)    Total Protein 6.2 (*)    GFR calc non Af Amer 37 (*)    GFR calc Af Amer 43 (*)    All other components within normal limits  CK - Abnormal; Notable for the following components:   Total CK 1,160 (*)    All other components within normal limits  TROPONIN I  URINALYSIS, COMPLETE (UACMP) WITH MICROSCOPIC   ____________________________________________   ____________________________________________  RADIOLOGY  CT of the head, C-spine, chest abdomen pelvis do not show any acute injuries  ____________________________________________   PROCEDURES  Procedure(s) performed: EKG   Procedures    ____________________________________________   INITIAL IMPRESSION / ASSESSMENT AND  PLAN / ED COURSE  Pertinent labs & imaging results that were available during my care of the patient were reviewed by me and considered in my medical decision making (see chart for details).   Patient is an 85 year old female presents emergency department via EMS from [place] due to the a second fall this week.  Patient has dementia and is a poor historian  Physical exam shows the patient to have multiple bruises some are old and some are new.  Ribs are tender and abdomen is tender.  Skin tear noted to the left elbow  Due to the dementia along with a fall, CT of the head, C-spine, chest abdomen pelvis  were ordered.  DDX: fall, MI, acute brain injury, rib fractures, liver laceration, multiple contusion  CT results do not show any acute injuries.  CBC has a low hemoglobin at 8.9.  The patient's hemoglobin has dropped from 10.7 from 07/13/2018.  Prior to that the patient's hemoglobin was 11. Hemoccult is negative, however do not feel this exam could be accurate as there was very little stool in the vault and the exam was very difficult Basic metabolic panel shows that the potassium has increased to 5.7 from 4.40 on 07/13/2018 and 4.6 on 08/17/17, the patient's glucose is also elevated at 208, patient's glucose levels are usually below 153,  renal functions are decreased which indicate renal failure   Due to possible admission the troponin and CPK were also ordered.  Discussed the case with Dr. Cinda Quest.  He agrees due to the altered lab test along with frequent fall she needs to be admitted to the hospital.  Called the secretary in the main ED she is to page the hospitalist. ----------------------------------------- 4:41 PM on 09/05/2018 -----------------------------------------  Had the secretary page the hospitalist again.  Hospitalist is coming to see the patient.  Troponin is normal, CK is greatly elevated at 1160    As part of my medical decision making, I reviewed the  following data within the [facility] notes reviewed and incorporated, Labs reviewed see above, EKG interpreted by heart station, Old EKG reviewed, Old chart reviewed, Radiograph reviewed CTs are negative for any acute injuries, Discussed with admitting physician hospitalist, Notes from prior ED visits and [place] Controlled Substance Database  ____________________________________________   FINAL CLINICAL IMPRESSION(S) / ED DIAGNOSES  Final diagnoses:  Fall, initial encounter  Decreased hemoglobin  Hyperkalemia  Acute renal failure, unspecified acute renal failure type ([place])  Traumatic rhabdomyolysis, initial encounter ([place])      NEW MEDICATIONS STARTED DURING THIS VISIT:  New Prescriptions   No medications on file     Note:  This document was prepared using Dragon voice recognition software and may include unintentional dictation errors.    Versie Starks, PA-C 09/05/18 1705    Versie Starks, PA-C 09/05/18 1710    Earleen Newport, MD 09/06/18 [phone]

## 2018-09-05 NOTE — ED Notes (Signed)
ED TO INPATIENT HANDOFF REPORT  ED Nurse Name and Phone #: Vaughan Basta   [phone]  S Name/Age/Gender Deborah Case 85 y.o. female Room/Bed: ED53A/ED53A  Code Status   Code Status: Not on file  Home/SNF/Other nursi Patient oriented to: self Is this baseline? Yes   Triage Complete: Triage complete  Chief Complaint fall ems  Triage Note Presents s/[p EMS from [facility] s/p fall   Per EMS this is her 2nd fall within 1 week  having pain to left elbow and right upper arm  When touching right elbow she is having pain to  lateral rib area  Pt has multiple bruised areas   Allergies Allergies  Allergen Reactions  . Metformin And Related     Level of Care/Admitting Diagnosis ED Disposition    ED Disposition Condition [facility] Area: [place] [[id number]]  Level of Care: Med-Surg [16]  Diagnosis: AKI (acute kidney injury) [facility]) [[id number]]  Admitting Physician: Saundra Shelling [[id number]]  Attending Physician: Saundra Shelling [[id number]]  Estimated length of stay: past midnight tomorrow  Certification:: I certify this patient will need inpatient services for at least 2 midnights  PT Class (Do Not Modify): Inpatient [101]  PT Acc Code (Do Not Modify): Private [1]       B Medical/Surgery History Past Medical History:  Diagnosis Date  . Cancer ([place])    uterine  . CHF (congestive heart failure) ([place])   . Dementia ([place])   . Diabetes mellitus without complication ([place])   . Hypercholesteremia   . Hypertension   . Pulmonary embolism ([place])   . Thyroid disease    Past Surgical History:  Procedure Laterality Date  . ABDOMINAL HYSTERECTOMY       A IV Location/Drains/Wounds Patient Lines/Drains/Airways Status   Active Line/Drains/Airways    Name:   Placement date:   Placement time:   Site:   Days:   Peripheral IV 09/05/18 Right Antecubital   09/05/18    1425    Antecubital   less than 1          Intake/Output Last 24 hours No intake or  output data in the 24 hours ending 09/05/18 1810  Labs/Imaging Results for orders placed or performed during the hospital encounter of 09/05/18 (from the past 48 hour(s))  CBC with Differential     Status: Abnormal   Collection Time: 09/05/18  2:23 PM  Result Value Ref Range   WBC 6.4 4.0 - 10.5 K/uL   RBC 3.05 (L) 3.87 - 5.11 MIL/uL   Hemoglobin 8.9 (L) 12.0 - 15.0 g/dL   HCT 28.6 (L) 36.0 - 46.0 %   MCV 93.8 80.0 - 100.0 fL   MCH 29.2 26.0 - 34.0 pg   MCHC 31.1 30.0 - 36.0 g/dL   RDW 15.7 (H) 11.5 - 15.5 %   Platelets 125 (L) 150 - 400 K/uL   nRBC 0.0 0.0 - 0.2 %   Neutrophils Relative % 79 %   Neutro Abs 5.1 1.7 - 7.7 K/uL   Lymphocytes Relative 14 %   Lymphs Abs 0.9 0.7 - 4.0 K/uL   Monocytes Relative 6 %   Monocytes Absolute 0.4 0.1 - 1.0 K/uL   Eosinophils Relative 0 %   Eosinophils Absolute 0.0 0.0 - 0.5 K/uL   Basophils Relative 0 %   Basophils Absolute 0.0 0.0 - 0.1 K/uL   Immature Granulocytes 1 %   Abs Immature Granulocytes 0.04 0.00 - 0.07 K/uL    Comment:  Performed at [facility], [address] [place], [place] [address]  Comprehensive metabolic panel     Status: Abnormal   Collection Time: 09/05/18  2:23 PM  Result Value Ref Range   Sodium 137 135 - 145 mmol/L   Potassium 5.7 (H) 3.5 - 5.1 mmol/L   Chloride 105 98 - 111 mmol/L   CO2 25 22 - 32 mmol/L   Glucose, Bld 208 (H) 70 - 99 mg/dL   BUN 50 (H) 8 - 23 mg/dL   Creatinine, Ser 1.31 (H) 0.44 - 1.00 mg/dL   Calcium 8.8 (L) 8.9 - 10.3 mg/dL   Total Protein 6.2 (L) 6.5 - 8.1 g/dL   Albumin 3.5 3.5 - 5.0 g/dL   AST 35 15 - 41 U/L   ALT 26 0 - 44 U/L   Alkaline Phosphatase 107 38 - 126 U/L   Total Bilirubin 0.7 0.3 - 1.2 mg/dL   GFR calc non Af Amer 37 (L) >60 mL/min   GFR calc Af Amer 43 (L) >60 mL/min   Anion gap 7 5 - 15    Comment: Performed at [facility], [address] [place], [place] [address]  Troponin I - Add-On to previous collection     Status: None   Collection  Time: 09/05/18  2:23 PM  Result Value Ref Range   Troponin I <0.03 <0.03 ng/mL    Comment: Performed at [facility], [address] [place], [place] [address]  CK     Status: Abnormal   Collection Time: 09/05/18  2:23 PM  Result Value Ref Range   Total CK 1,160 (H) 38 - 234 U/L    Comment: Performed at [facility], [address] [place], [place] [address]  Urinalysis, Complete w Microscopic     Status: Abnormal   Collection Time: 09/05/18  5:27 PM  Result Value Ref Range   Color, Urine YELLOW (A) YELLOW   APPearance HAZY (A) CLEAR   Specific Gravity, Urine 1.025 1.005 - 1.030   pH 6.0 5.0 - 8.0   Glucose, UA NEGATIVE NEGATIVE mg/dL   Hgb urine dipstick NEGATIVE NEGATIVE   Bilirubin Urine NEGATIVE NEGATIVE   Ketones, ur NEGATIVE NEGATIVE mg/dL   Protein, ur NEGATIVE NEGATIVE mg/dL   Nitrite POSITIVE (A) NEGATIVE   Leukocytes,Ua MODERATE (A) NEGATIVE   RBC / HPF 0-5 0 - 5 RBC/hpf   WBC, UA >50 (H) 0 - 5 WBC/hpf   Bacteria, UA RARE (A) NONE SEEN   Squamous Epithelial / LPF 0-5 0 - 5   WBC Clumps PRESENT    Mucus PRESENT     Comment: Performed at [facility], [address], [place], [place] [address]   Ct Head Wo Contrast  Result Date: 09/05/2018 CLINICAL DATA:  Polytrauma. Suspected head and cervical spine injury. EXAM: CT HEAD WITHOUT CONTRAST CT CERVICAL SPINE WITHOUT CONTRAST TECHNIQUE: Multidetector CT imaging of the head and cervical spine was performed following the standard protocol without intravenous contrast. Multiplanar CT image reconstructions of the cervical spine were also generated. COMPARISON:  CT 08/30/2018. FINDINGS: CT HEAD FINDINGS Brain: There is no evidence of acute intracranial hemorrhage, mass lesion, brain edema or extra-axial fluid collection. The ventricles and subarachnoid spaces are appropriately sized for age. There is no CT evidence of acute cortical infarction. There are stable mild chronic small vessel ischemic changes  in the periventricular white matter. Vascular: Intracranial vascular calcifications. No hyperdense vessel identified. Skull: Negative for fracture or focal lesion. Sinuses/Orbits:  The visualized paranasal sinuses and mastoid air cells are clear. No orbital abnormalities are seen. Other: Interval improved left supraorbital scalp hematoma. CT CERVICAL SPINE FINDINGS Alignment: Stable mild convex left scoliosis. The lateral alignment is normal. Skull base and vertebrae: No evidence of acute cervical spine fracture or traumatic subluxation. Soft tissues and spinal canal: No prevertebral fluid or swelling. No visible canal hematoma. Disc levels: Stable calcified pannus surrounding the odontoid process. There is stable spondylosis, greatest at C6-7. No high-grade spinal stenosis. Upper chest: Unremarkable. Other: Bilateral carotid atherosclerosis. IMPRESSION: 1. No acute intracranial or calvarial findings. 2. No evidence of acute cervical spine fracture, traumatic subluxation or static signs of instability. 3. Stable mild cervical spondylosis. Electronically Signed   By: Richardean Sale M.D.   On: 09/05/2018 15:30   Ct Chest W Contrast  Result Date: 09/05/2018 CLINICAL DATA:  Blunt abdominal trauma. History of dementia, diabetes and uterine cancer. EXAM: CT CHEST, ABDOMEN, AND PELVIS WITH CONTRAST TECHNIQUE: Multidetector CT imaging of the chest, abdomen and pelvis was performed following the standard protocol during bolus administration of intravenous contrast. CONTRAST:  71mL OMNIPAQUE IOHEXOL 300 MG/ML  SOLN COMPARISON:  CT abdomen pelvis 11/26/2014. FINDINGS: CT CHEST FINDINGS Cardiovascular: Atherosclerosis of the aorta, great vessels and coronary arteries. No acute vascular findings or evidence of mediastinal hematoma. The heart size is normal. There is no pericardial effusion. Mediastinum/Nodes: There are no enlarged mediastinal, hilar or axillary lymph nodes. There is a stable small hiatal hernia. The trachea  and thyroid gland appear unremarkable. Lungs/Pleura: There is no pleural effusion or pneumothorax. There is mild emphysema with dependent atelectasis in both lungs. 3 mm subpleural left upper lobe nodule on image 76/5 is likely a lymph node. No suspicious nodules. Musculoskeletal/Chest wall: No chest wall mass or suspicious osseous findings. CT ABDOMEN AND PELVIS FINDINGS Hepatobiliary: The liver is normal in density without suspicious focal abnormality. No evidence of gallstones, gallbladder wall thickening or biliary dilatation. Pancreas: Unremarkable. No pancreatic ductal dilatation or surrounding inflammatory changes. Spleen: Normal in size without focal abnormality. Adrenals/Urinary Tract: Both adrenal glands appear normal. 17 mm exophytic lesion involving the lower pole of the left kidney (image 74/2) is unchanged in size from the 2016 CT. This measures higher than water density, although is presumably a complex cysts based on stability. There is mild renal cortical thinning bilaterally. No hydronephrosis or perinephric fluid collection. The bladder appears normal. Stomach/Bowel: No evidence of bowel wall thickening, distention or surrounding inflammatory change. Moderate stool throughout the colon. Vascular/Lymphatic: There are no enlarged abdominal or pelvic lymph nodes. Moderate diffuse aortic and branch vessel atherosclerosis without acute vascular findings. Reproductive: Hysterectomy.  No adnexal mass. Other: There is new mild soft tissue stranding in the perirectal fat. No evidence of pelvic hematoma, focal fluid collection or free air. Peripherally calcified nodules in the pelvic fat, likely fat necrosis. Musculoskeletal: No acute or significant osseous findings. Chronic bilateral common hamstring enthesopathic changes. IMPRESSION: 1. No acute posttraumatic findings identified in the chest, abdomen or pelvis. 2. Mild soft tissue stranding in the perirectal fat, likely inflammatory. No associated bowel  wall thickening or obstruction. 3. Diffuse Aortic Atherosclerosis (ICD10-I70.0). 4. Previously described left renal lesion is stable in size, presumably a complex cyst based on stability from 2016. Electronically Signed   By: Richardean Sale M.D.   On: 09/05/2018 15:45   Ct Cervical Spine Wo Contrast  Result Date: 09/05/2018 CLINICAL DATA:  Polytrauma. Suspected head and cervical spine injury. EXAM: CT HEAD WITHOUT CONTRAST CT CERVICAL SPINE  WITHOUT CONTRAST TECHNIQUE: Multidetector CT imaging of the head and cervical spine was performed following the standard protocol without intravenous contrast. Multiplanar CT image reconstructions of the cervical spine were also generated. COMPARISON:  CT 08/30/2018. FINDINGS: CT HEAD FINDINGS Brain: There is no evidence of acute intracranial hemorrhage, mass lesion, brain edema or extra-axial fluid collection. The ventricles and subarachnoid spaces are appropriately sized for age. There is no CT evidence of acute cortical infarction. There are stable mild chronic small vessel ischemic changes in the periventricular white matter. Vascular: Intracranial vascular calcifications. No hyperdense vessel identified. Skull: Negative for fracture or focal lesion. Sinuses/Orbits: The visualized paranasal sinuses and mastoid air cells are clear. No orbital abnormalities are seen. Other: Interval improved left supraorbital scalp hematoma. CT CERVICAL SPINE FINDINGS Alignment: Stable mild convex left scoliosis. The lateral alignment is normal. Skull base and vertebrae: No evidence of acute cervical spine fracture or traumatic subluxation. Soft tissues and spinal canal: No prevertebral fluid or swelling. No visible canal hematoma. Disc levels: Stable calcified pannus surrounding the odontoid process. There is stable spondylosis, greatest at C6-7. No high-grade spinal stenosis. Upper chest: Unremarkable. Other: Bilateral carotid atherosclerosis. IMPRESSION: 1. No acute intracranial or  calvarial findings. 2. No evidence of acute cervical spine fracture, traumatic subluxation or static signs of instability. 3. Stable mild cervical spondylosis. Electronically Signed   By: Richardean Sale M.D.   On: 09/05/2018 15:30   Ct Abdomen Pelvis W Contrast  Result Date: 09/05/2018 CLINICAL DATA:  Blunt abdominal trauma. History of dementia, diabetes and uterine cancer. EXAM: CT CHEST, ABDOMEN, AND PELVIS WITH CONTRAST TECHNIQUE: Multidetector CT imaging of the chest, abdomen and pelvis was performed following the standard protocol during bolus administration of intravenous contrast. CONTRAST:  74mL OMNIPAQUE IOHEXOL 300 MG/ML  SOLN COMPARISON:  CT abdomen pelvis 11/26/2014. FINDINGS: CT CHEST FINDINGS Cardiovascular: Atherosclerosis of the aorta, great vessels and coronary arteries. No acute vascular findings or evidence of mediastinal hematoma. The heart size is normal. There is no pericardial effusion. Mediastinum/Nodes: There are no enlarged mediastinal, hilar or axillary lymph nodes. There is a stable small hiatal hernia. The trachea and thyroid gland appear unremarkable. Lungs/Pleura: There is no pleural effusion or pneumothorax. There is mild emphysema with dependent atelectasis in both lungs. 3 mm subpleural left upper lobe nodule on image 76/5 is likely a lymph node. No suspicious nodules. Musculoskeletal/Chest wall: No chest wall mass or suspicious osseous findings. CT ABDOMEN AND PELVIS FINDINGS Hepatobiliary: The liver is normal in density without suspicious focal abnormality. No evidence of gallstones, gallbladder wall thickening or biliary dilatation. Pancreas: Unremarkable. No pancreatic ductal dilatation or surrounding inflammatory changes. Spleen: Normal in size without focal abnormality. Adrenals/Urinary Tract: Both adrenal glands appear normal. 17 mm exophytic lesion involving the lower pole of the left kidney (image 74/2) is unchanged in size from the 2016 CT. This measures higher than  water density, although is presumably a complex cysts based on stability. There is mild renal cortical thinning bilaterally. No hydronephrosis or perinephric fluid collection. The bladder appears normal. Stomach/Bowel: No evidence of bowel wall thickening, distention or surrounding inflammatory change. Moderate stool throughout the colon. Vascular/Lymphatic: There are no enlarged abdominal or pelvic lymph nodes. Moderate diffuse aortic and branch vessel atherosclerosis without acute vascular findings. Reproductive: Hysterectomy.  No adnexal mass. Other: There is new mild soft tissue stranding in the perirectal fat. No evidence of pelvic hematoma, focal fluid collection or free air. Peripherally calcified nodules in the pelvic fat, likely fat necrosis. Musculoskeletal: No acute or  significant osseous findings. Chronic bilateral common hamstring enthesopathic changes. IMPRESSION: 1. No acute posttraumatic findings identified in the chest, abdomen or pelvis. 2. Mild soft tissue stranding in the perirectal fat, likely inflammatory. No associated bowel wall thickening or obstruction. 3. Diffuse Aortic Atherosclerosis (ICD10-I70.0). 4. Previously described left renal lesion is stable in size, presumably a complex cyst based on stability from 2016. Electronically Signed   By: Richardean Sale M.D.   On: 09/05/2018 15:45    Pending Labs [employer] (From admission, onward)    Start     Ordered   Signed and Held  CBC  (heparin)  Once,   R    Comments:  Baseline for heparin therapy IF NOT ALREADY DRAWN.  Notify MD if PLT < 100 K.    Signed and Held   Signed and Held  Creatinine, serum  (heparin)  Once,   R    Comments:  Baseline for heparin therapy IF NOT ALREADY DRAWN.    Signed and Held   Signed and Held  Basic metabolic panel  Tomorrow morning,   R     Signed and Held   Signed and Held  CBC  Tomorrow morning,   R     Signed and Held          Vitals/Pain Today's Vitals   09/05/18 1400 09/05/18  1401 09/05/18 1403 09/05/18 1611  BP:   116/78 120/76  Pulse:   61 67  Resp:   16 16  Temp:   98 F (36.7 C)   TempSrc:   Oral   SpO2:   98% 99%  Weight:  63 kg    Height:  5\' 2"  (1.575 m)    PainSc: 6    5     Isolation Precautions No active isolations  Medications Medications  sodium polystyrene (KAYEXALATE) 15 GM/60ML suspension 15 g (has no administration in time range)  iohexol (OMNIPAQUE) 300 MG/ML solution 75 mL (75 mLs Intravenous Contrast Given 09/05/18 1503)    Mobility walks High fall risk   Focused Assess      , Pulmonary Assessment Handoff:  Lung sounds:  clear  O2 Device: Room Air     ,    R Recommendations: See Admitting Provider Note  Report given to: Malka RN  1 C  Additional Notes:   Informed that patient is yelling out at present   Encouraged to lie back in bed

## 2018-09-05 NOTE — H&P (Signed)
[place] [facility] NAME: Deborah Case    MR#:  [id number]  DATE OF BIRTH:  1930/05/19  DATE OF ADMISSION:  09/05/2018  PRIMARY CARE PHYSICIAN: Hortencia Pilar, MD   REQUESTING/REFERRING PHYSICIAN:   CHIEF COMPLAINT:   Chief Complaint  Patient presents with  . Fall    HISTORY OF PRESENT ILLNESS: Deborah Case  is a 85 y.o. female with a known history of dementia, chronic congestive heart failure, type 2 diabetes mellitus, hyperlipidemia, hypertension is a resident of [facility].  She had 2 falls in the last couple of weeks.  Patient has generalized weakness.  Has dementia unable to give much history.  She was worked up in the emergency room her potassium was 5.5 and kidney functions have worsened and appears dehydrated.  Patient was worked up with CT head which showed no acute abnormality, CT chest no acute abnormality and CT cervical spine showed no fractures.  PAST MEDICAL HISTORY:   Past Medical History:  Diagnosis Date  . Cancer ([place])    uterine  . CHF (congestive heart failure) ([place])   . Dementia ([place])   . Diabetes mellitus without complication ([place])   . Hypercholesteremia   . Hypertension   . Pulmonary embolism ([place])   . Thyroid disease     PAST SURGICAL HISTORY:  Past Surgical History:  Procedure Laterality Date  . ABDOMINAL HYSTERECTOMY      SOCIAL HISTORY:  Social History   Tobacco Use  . Smoking status: Never Smoker  . Smokeless tobacco: Never Used  Substance Use Topics  . Alcohol use: No    FAMILY HISTORY: No family history on file.  DRUG ALLERGIES:  Allergies  Allergen Reactions  . Metformin And Related     REVIEW OF SYSTEMS:  Could not be obtained secondary to dementia MEDICATIONS AT HOME:  Prior to Admission medications   Medication Sig Start Date End Date Taking? Authorizing Provider  acetaminophen (TYLENOL) 500 MG tablet Take 500 mg by mouth every 4 (four) hours as needed for mild  pain or moderate pain.    [provider]  alum & mag hydroxide-simeth ([facility]) 200-200-20 MG/5ML suspension Take 30 mLs by mouth every 6 (six) hours as needed for indigestion or heartburn.    [provider]  atorvastatin (LIPITOR) 80 MG tablet Take 80 mg by mouth at bedtime.  06/16/18 06/17/19  [provider]  carvedilol (COREG) 6.25 MG tablet Take 6.25 mg by mouth daily.     [provider]  clopidogrel (PLAVIX) 75 MG tablet Take 75 mg by mouth daily.    [provider]  guaifenesin (ROBITUSSIN) 100 MG/5ML syrup Take 200 mg by mouth every 6 (six) hours as needed for cough.    [provider]  levothyroxine (SYNTHROID, LEVOTHROID) 88 MCG tablet Take 88 mcg by mouth daily before breakfast.    [provider]  lisinopril (PRINIVIL,ZESTRIL) 10 MG tablet Take 10 mg by mouth daily. 12/22/17 12/23/18  [provider]  loperamide (IMODIUM) 2 MG capsule Take 2 mg by mouth as needed for diarrhea or loose stools. (max 8 doses in 24 hours)    [provider]  magnesium hydroxide (MILK OF MAGNESIA) 400 MG/5ML suspension Take 30 mLs by mouth daily as needed for mild constipation.    [provider]  neomycin-bacitracin-polymyxin (NEOSPORIN) ointment Apply 1 application topically as needed for wound care.    [provider]      PHYSICAL EXAMINATION:  VITAL SIGNS: Blood pressure 120/76, pulse 67, temperature 98 F (36.7 C), temperature source Oral, resp. rate 16, height 5\' 2"  (1.575 m), weight 63 kg, SpO2 99 %.  GENERAL:  85 y.o.-year-old patient lying in the bed with no acute distress.  EYES: Pupils equal, round, reactive to light and accommodation. No scleral icterus. Extraocular muscles intact.  HEENT: Head atraumatic, normocephalic. Oropharynx dry and nasopharynx clear.  NECK:  Supple, no jugular venous distention. No thyroid enlargement, no tenderness.  LUNGS: Normal breath sounds bilaterally, no  wheezing, rales,rhonchi or crepitation. No use of accessory muscles of respiration.  CARDIOVASCULAR: S1, S2 normal. No murmurs, rubs, or gallops.  ABDOMEN: Soft, nontender, nondistended. Bowel sounds present. No organomegaly or mass.  EXTREMITIES: No pedal edema, cyanosis, or clubbing.  NEUROLOGIC: Cranial nerves II through XII are intact. Muscle strength 5/5 in all extremities. Sensation intact. Gait not checked.  PSYCHIATRIC: The patient is alert and oriented x 1.  SKIN: No obvious rash, lesion, or ulcer.   LABORATORY PANEL:   CBC Recent Labs  Lab 09/05/18 1423  WBC 6.4  HGB 8.9*  HCT 28.6*  PLT 125*  MCV 93.8  MCH 29.2  MCHC 31.1  RDW 15.7*  LYMPHSABS 0.9  MONOABS 0.4  EOSABS 0.0  BASOSABS 0.0   ------------------------------------------------------------------------------------------------------------------  Chemistries  Recent Labs  Lab 09/05/18 1423  NA 137  K 5.7*  CL 105  CO2 25  GLUCOSE 208*  BUN 50*  CREATININE 1.31*  CALCIUM 8.8*  AST 35  ALT 26  ALKPHOS 107  BILITOT 0.7   ------------------------------------------------------------------------------------------------------------------ estimated creatinine clearance is 26.9 mL/min (A) (by C-G formula based on SCr of 1.31 mg/dL (H)). ------------------------------------------------------------------------------------------------------------------ No results for input(s): TSH, T4TOTAL, T3FREE, THYROIDAB in the last 72 hours.  Invalid input(s): FREET3   Coagulation profile No results for input(s): INR, PROTIME in the last 168 hours. ------------------------------------------------------------------------------------------------------------------- No results for input(s): DDIMER in the last 72 hours. -------------------------------------------------------------------------------------------------------------------  Cardiac Enzymes Recent Labs  Lab 09/05/18 1423  TROPONINI <0.03    ------------------------------------------------------------------------------------------------------------------ Invalid input(s): POCBNP  ---------------------------------------------------------------------------------------------------------------  Urinalysis    Component Value Date/Time   COLORURINE STRAW (A) 07/13/2018 1541   APPEARANCEUR CLEAR (A) 07/13/2018 1541   APPEARANCEUR Clear 12/19/2012 2216   LABSPEC 1.005 07/13/2018 1541   LABSPEC 1.016 12/19/2012 2216   PHURINE 7.0 07/13/2018 1541   GLUCOSEU NEGATIVE 07/13/2018 1541   GLUCOSEU Negative 12/19/2012 2216   HGBUR NEGATIVE 07/13/2018 1541   BILIRUBINUR NEGATIVE 07/13/2018 1541   BILIRUBINUR Negative 12/19/2012 2216   KETONESUR NEGATIVE 07/13/2018 1541   PROTEINUR NEGATIVE 07/13/2018 1541   NITRITE NEGATIVE 07/13/2018 1541   LEUKOCYTESUR NEGATIVE 07/13/2018 1541   LEUKOCYTESUR Negative 12/19/2012 2216     RADIOLOGY: Ct Head Wo Contrast  Result Date: 09/05/2018 CLINICAL DATA:  Polytrauma. Suspected head and cervical spine injury. EXAM: CT HEAD WITHOUT CONTRAST CT CERVICAL SPINE WITHOUT CONTRAST TECHNIQUE: Multidetector CT imaging of the head and cervical spine was performed following the standard protocol without intravenous contrast. Multiplanar CT image reconstructions of the cervical spine were also generated. COMPARISON:  CT 08/30/2018. FINDINGS: CT HEAD FINDINGS Brain: There is no evidence of acute intracranial hemorrhage, mass lesion, brain edema or extra-axial fluid collection. The ventricles and subarachnoid spaces are appropriately sized for age. There is no CT evidence of acute cortical infarction. There are stable mild chronic small vessel ischemic changes in the periventricular white matter. Vascular: Intracranial vascular calcifications. No hyperdense vessel identified. Skull: Negative for fracture or focal lesion. Sinuses/Orbits: The visualized paranasal sinuses and  mastoid air cells are clear. No orbital  abnormalities are seen. Other: Interval improved left supraorbital scalp hematoma. CT CERVICAL SPINE FINDINGS Alignment: Stable mild convex left scoliosis. The lateral alignment is normal. Skull base and vertebrae: No evidence of acute cervical spine fracture or traumatic subluxation. Soft tissues and spinal canal: No prevertebral fluid or swelling. No visible canal hematoma. Disc levels: Stable calcified pannus surrounding the odontoid process. There is stable spondylosis, greatest at C6-7. No high-grade spinal stenosis. Upper chest: Unremarkable. Other: Bilateral carotid atherosclerosis. IMPRESSION: 1. No acute intracranial or calvarial findings. 2. No evidence of acute cervical spine fracture, traumatic subluxation or static signs of instability. 3. Stable mild cervical spondylosis. Electronically Signed   By: Richardean Sale M.D.   On: 09/05/2018 15:30   Ct Chest W Contrast  Result Date: 09/05/2018 CLINICAL DATA:  Blunt abdominal trauma. History of dementia, diabetes and uterine cancer. EXAM: CT CHEST, ABDOMEN, AND PELVIS WITH CONTRAST TECHNIQUE: Multidetector CT imaging of the chest, abdomen and pelvis was performed following the standard protocol during bolus administration of intravenous contrast. CONTRAST:  39mL OMNIPAQUE IOHEXOL 300 MG/ML  SOLN COMPARISON:  CT abdomen pelvis 11/26/2014. FINDINGS: CT CHEST FINDINGS Cardiovascular: Atherosclerosis of the aorta, great vessels and coronary arteries. No acute vascular findings or evidence of mediastinal hematoma. The heart size is normal. There is no pericardial effusion. Mediastinum/Nodes: There are no enlarged mediastinal, hilar or axillary lymph nodes. There is a stable small hiatal hernia. The trachea and thyroid gland appear unremarkable. Lungs/Pleura: There is no pleural effusion or pneumothorax. There is mild emphysema with dependent atelectasis in both lungs. 3 mm subpleural left upper lobe nodule on image 76/5 is likely a lymph node. No suspicious  nodules. Musculoskeletal/Chest wall: No chest wall mass or suspicious osseous findings. CT ABDOMEN AND PELVIS FINDINGS Hepatobiliary: The liver is normal in density without suspicious focal abnormality. No evidence of gallstones, gallbladder wall thickening or biliary dilatation. Pancreas: Unremarkable. No pancreatic ductal dilatation or surrounding inflammatory changes. Spleen: Normal in size without focal abnormality. Adrenals/Urinary Tract: Both adrenal glands appear normal. 17 mm exophytic lesion involving the lower pole of the left kidney (image 74/2) is unchanged in size from the 2016 CT. This measures higher than water density, although is presumably a complex cysts based on stability. There is mild renal cortical thinning bilaterally. No hydronephrosis or perinephric fluid collection. The bladder appears normal. Stomach/Bowel: No evidence of bowel wall thickening, distention or surrounding inflammatory change. Moderate stool throughout the colon. Vascular/Lymphatic: There are no enlarged abdominal or pelvic lymph nodes. Moderate diffuse aortic and branch vessel atherosclerosis without acute vascular findings. Reproductive: Hysterectomy.  No adnexal mass. Other: There is new mild soft tissue stranding in the perirectal fat. No evidence of pelvic hematoma, focal fluid collection or free air. Peripherally calcified nodules in the pelvic fat, likely fat necrosis. Musculoskeletal: No acute or significant osseous findings. Chronic bilateral common hamstring enthesopathic changes. IMPRESSION: 1. No acute posttraumatic findings identified in the chest, abdomen or pelvis. 2. Mild soft tissue stranding in the perirectal fat, likely inflammatory. No associated bowel wall thickening or obstruction. 3. Diffuse Aortic Atherosclerosis (ICD10-I70.0). 4. Previously described left renal lesion is stable in size, presumably a complex cyst based on stability from 2016. Electronically Signed   By: Richardean Sale M.D.   On:  09/05/2018 15:45   Ct Cervical Spine Wo Contrast  Result Date: 09/05/2018 CLINICAL DATA:  Polytrauma. Suspected head and cervical spine injury. EXAM: CT HEAD WITHOUT CONTRAST CT CERVICAL SPINE WITHOUT CONTRAST TECHNIQUE: Multidetector  CT imaging of the head and cervical spine was performed following the standard protocol without intravenous contrast. Multiplanar CT image reconstructions of the cervical spine were also generated. COMPARISON:  CT 08/30/2018. FINDINGS: CT HEAD FINDINGS Brain: There is no evidence of acute intracranial hemorrhage, mass lesion, brain edema or extra-axial fluid collection. The ventricles and subarachnoid spaces are appropriately sized for age. There is no CT evidence of acute cortical infarction. There are stable mild chronic small vessel ischemic changes in the periventricular white matter. Vascular: Intracranial vascular calcifications. No hyperdense vessel identified. Skull: Negative for fracture or focal lesion. Sinuses/Orbits: The visualized paranasal sinuses and mastoid air cells are clear. No orbital abnormalities are seen. Other: Interval improved left supraorbital scalp hematoma. CT CERVICAL SPINE FINDINGS Alignment: Stable mild convex left scoliosis. The lateral alignment is normal. Skull base and vertebrae: No evidence of acute cervical spine fracture or traumatic subluxation. Soft tissues and spinal canal: No prevertebral fluid or swelling. No visible canal hematoma. Disc levels: Stable calcified pannus surrounding the odontoid process. There is stable spondylosis, greatest at C6-7. No high-grade spinal stenosis. Upper chest: Unremarkable. Other: Bilateral carotid atherosclerosis. IMPRESSION: 1. No acute intracranial or calvarial findings. 2. No evidence of acute cervical spine fracture, traumatic subluxation or static signs of instability. 3. Stable mild cervical spondylosis. Electronically Signed   By: Richardean Sale M.D.   On: 09/05/2018 15:30   Ct Abdomen Pelvis W  Contrast  Result Date: 09/05/2018 CLINICAL DATA:  Blunt abdominal trauma. History of dementia, diabetes and uterine cancer. EXAM: CT CHEST, ABDOMEN, AND PELVIS WITH CONTRAST TECHNIQUE: Multidetector CT imaging of the chest, abdomen and pelvis was performed following the standard protocol during bolus administration of intravenous contrast. CONTRAST:  63mL OMNIPAQUE IOHEXOL 300 MG/ML  SOLN COMPARISON:  CT abdomen pelvis 11/26/2014. FINDINGS: CT CHEST FINDINGS Cardiovascular: Atherosclerosis of the aorta, great vessels and coronary arteries. No acute vascular findings or evidence of mediastinal hematoma. The heart size is normal. There is no pericardial effusion. Mediastinum/Nodes: There are no enlarged mediastinal, hilar or axillary lymph nodes. There is a stable small hiatal hernia. The trachea and thyroid gland appear unremarkable. Lungs/Pleura: There is no pleural effusion or pneumothorax. There is mild emphysema with dependent atelectasis in both lungs. 3 mm subpleural left upper lobe nodule on image 76/5 is likely a lymph node. No suspicious nodules. Musculoskeletal/Chest wall: No chest wall mass or suspicious osseous findings. CT ABDOMEN AND PELVIS FINDINGS Hepatobiliary: The liver is normal in density without suspicious focal abnormality. No evidence of gallstones, gallbladder wall thickening or biliary dilatation. Pancreas: Unremarkable. No pancreatic ductal dilatation or surrounding inflammatory changes. Spleen: Normal in size without focal abnormality. Adrenals/Urinary Tract: Both adrenal glands appear normal. 17 mm exophytic lesion involving the lower pole of the left kidney (image 74/2) is unchanged in size from the 2016 CT. This measures higher than water density, although is presumably a complex cysts based on stability. There is mild renal cortical thinning bilaterally. No hydronephrosis or perinephric fluid collection. The bladder appears normal. Stomach/Bowel: No evidence of bowel wall thickening,  distention or surrounding inflammatory change. Moderate stool throughout the colon. Vascular/Lymphatic: There are no enlarged abdominal or pelvic lymph nodes. Moderate diffuse aortic and branch vessel atherosclerosis without acute vascular findings. Reproductive: Hysterectomy.  No adnexal mass. Other: There is new mild soft tissue stranding in the perirectal fat. No evidence of pelvic hematoma, focal fluid collection or free air. Peripherally calcified nodules in the pelvic fat, likely fat necrosis. Musculoskeletal: No acute or significant osseous findings. Chronic  bilateral common hamstring enthesopathic changes. IMPRESSION: 1. No acute posttraumatic findings identified in the chest, abdomen or pelvis. 2. Mild soft tissue stranding in the perirectal fat, likely inflammatory. No associated bowel wall thickening or obstruction. 3. Diffuse Aortic Atherosclerosis (ICD10-I70.0). 4. Previously described left renal lesion is stable in size, presumably a complex cyst based on stability from 2016. Electronically Signed   By: Richardean Sale M.D.   On: 09/05/2018 15:45    EKG: Orders placed or performed during the hospital encounter of 09/05/18  . ED EKG  . ED EKG  . EKG 12-Lead  . EKG 12-Lead    IMPRESSION AND PLAN:  85 year old female patient with a known history of dementia, chronic congestive heart failure, type 2 diabetes mellitus, hyperlipidemia, hypertension is a resident of [facility] facility presented to emergency room for fall  -Acute hyperkalemia Could be secondary to renal insufficiency Kayexalate 15 g orally 1 dose  -Acute kidney injury Hold diuretics IV fluid hydration  -Acute rhabdomyolysis IV fluid hydration follow-up CK level Secondary to fall  -Dementia Supportive care  -Uncontrolled diabetes mellitus Diabetic diet with sliding scale coverage with insulin  -DVT prophylaxis with subcu heparin  All the records are reviewed and case discussed with ED  provider. Management plans discussed with the patient, family and they are in agreement.  CODE STATUS:Full code  TOTAL TIME TAKING CARE OF THIS PATIENT: 52 minutes.    Saundra Shelling M.D on 09/05/2018 at 5:19 PM  Between 7am to 6pm - Pager - [phone]  After 6pm go to [email or web address] - password [email or web address] [facility]  Office  [phone]  CC: Primary care physician; Hortencia Pilar, MD

## 2018-09-05 NOTE — ED Notes (Signed)
Left elbow cleansed and dressed with dry dressing and conform  Left knee dressing removed   This is an older injury  Dried blood noted around knee   With some redness  Area cleansed and dressing applied

## 2018-09-05 NOTE — ED Triage Notes (Addendum)
Presents s/[p EMS from [facility] s/p fall   Per EMS this is her 2nd fall within 1 week  having pain to left elbow and right upper arm  When touching right elbow she is having pain to  lateral rib area  Pt has multiple bruised areas

## 2018-09-05 NOTE — ED Notes (Signed)
Back from ct via stretcher.. left elbow dressing removed  No bleeding noted large bruised area noted

## 2018-09-06 LAB — BASIC METABOLIC PANEL
Anion gap: 8 (ref 5–15)
BUN: 38 mg/dL — ABNORMAL HIGH (ref 8–23)
CO2: 23 mmol/L (ref 22–32)
Calcium: 8.5 mg/dL — ABNORMAL LOW (ref 8.9–10.3)
Chloride: 109 mmol/L (ref 98–111)
Creatinine, Ser: 1.06 mg/dL — ABNORMAL HIGH (ref 0.44–1.00)
GFR calc Af Amer: 55 mL/min — ABNORMAL LOW (ref >60)
GFR calc non Af Amer: 48 mL/min — ABNORMAL LOW (ref >60)
Glucose, Bld: 95 mg/dL (ref 70–99)
Potassium: 4.7 mmol/L (ref 3.5–5.1)
Sodium: 140 mmol/L (ref 135–145)

## 2018-09-06 LAB — GLUCOSE, CAPILLARY
Glucose-Capillary: 89 mg/dL (ref 70–99)
Glucose-Capillary: 143 mg/dL — ABNORMAL HIGH (ref 70–99)
Glucose-Capillary: 91 mg/dL (ref 70–99)

## 2018-09-06 LAB — CBC
HCT: 26.6 % — ABNORMAL LOW (ref 36.0–46.0)
Hemoglobin: 8.4 g/dL — ABNORMAL LOW (ref 12.0–15.0)
MCH: 29.4 pg (ref 26.0–34.0)
MCHC: 31.6 g/dL (ref 30.0–36.0)
MCV: 93 fL (ref 80.0–100.0)
Platelets: 122 10*3/uL — ABNORMAL LOW (ref 150–400)
RBC: 2.86 MIL/uL — ABNORMAL LOW (ref 3.87–5.11)
RDW: 15.7 % — ABNORMAL HIGH (ref 11.5–15.5)
WBC: 5.3 10*3/uL (ref 4.0–10.5)
nRBC: 0 % (ref 0.0–0.2)

## 2018-09-06 LAB — HEMOGLOBIN A1C
Hgb A1c MFr Bld: 6.2 % — ABNORMAL HIGH (ref 4.8–5.6)
Mean Plasma Glucose: 131.24 mg/dL

## 2018-09-06 MED ORDER — QUETIAPINE FUMARATE 25 MG PO TABS: 25.0000 mg | ORAL_TABLET | Freq: Once | ORAL | Status: DC

## 2018-09-06 MED ORDER — INSULIN ASPART 100 UNIT/ML ~~LOC~~ SOLN: 0.0000 [IU] | Freq: Every day | SUBCUTANEOUS | Status: DC

## 2018-09-06 MED ORDER — AMLODIPINE BESYLATE 5 MG PO TABS: 5.0000 mg | ORAL_TABLET | Freq: Every day | ORAL | Status: DC

## 2018-09-06 MED ORDER — HYDRALAZINE HCL 20 MG/ML IJ SOLN: 10.0000 mg | Freq: Four times a day (QID) | INTRAMUSCULAR | Status: DC | PRN

## 2018-09-06 MED ORDER — QUETIAPINE FUMARATE 25 MG PO TABS
25.0000 mg | ORAL_TABLET | Freq: Every evening | ORAL | Status: DC | PRN
  Administered 2018-09-06: 25 mg via ORAL
  Filled 2018-09-06: qty 1

## 2018-09-06 MED ORDER — CARVEDILOL 12.5 MG PO TABS
12.5000 mg | ORAL_TABLET | Freq: Every day | ORAL | Status: DC
  Administered 2018-09-06 – 2018-09-07 (×2): 12.5 mg via ORAL
  Filled 2018-09-06: qty 1
  Filled 2018-09-06 (×2): qty 4
  Filled 2018-09-06 (×2): qty 1

## 2018-09-06 MED ORDER — BISACODYL 5 MG PO TBEC: 5.0000 mg | DELAYED_RELEASE_TABLET | Freq: Every day | ORAL | Status: DC | PRN

## 2018-09-06 MED ORDER — INSULIN ASPART 100 UNIT/ML ~~LOC~~ SOLN
0.0000 [IU] | Freq: Three times a day (TID) | SUBCUTANEOUS | Status: DC
  Administered 2018-09-06 – 2018-09-07 (×2): 1 [IU] via SUBCUTANEOUS
  Administered 2018-09-07: 2 [IU] via SUBCUTANEOUS
  Administered 2018-09-08: 18:00:00 1 [IU] via SUBCUTANEOUS
  Administered 2018-09-09: 2 [IU] via SUBCUTANEOUS
  Filled 2018-09-06 (×4): qty 1

## 2018-09-06 MED ORDER — HALOPERIDOL LACTATE 5 MG/ML IJ SOLN: 2.0000 mg | Freq: Four times a day (QID) | INTRAMUSCULAR | Status: DC | PRN

## 2018-09-06 NOTE — TOC Progression Note (Signed)
Transition of Care [facility]) - Progression Note    Patient Details  Name: Deborah Case MRN: [id number] Date of Birth: [birth year]  Transition of [facility]) CM/SW [place], RN Phone Number: 09/06/2018, 12:03 PM  Clinical Narrative:    Damaris Schooner to Jenny Reichmann at [facility], she requested that we set up Home health with [facility], She also requested [place] to get the patient a RW.  I called Helene Kelp with [facility] and requested them to accept the patient and she agreed, I called Brad with [employer] and set up a RW to be delivered to the patient's room before DC. He agreed to deliver the RW.   Expected Discharge Plan: Rest Home Barriers to Discharge: Continued Medical Work up  Expected Discharge Plan and Services Expected Discharge Plan: Rest Home       Living arrangements for the past 2 months: [place]                           Social Determinants of Health (SDOH) Interventions    Readmission Risk Interventions No flowsheet data found.

## 2018-09-06 NOTE — Progress Notes (Signed)
[facility] NAME: Deborah Case    MR#:  [id number]  DATE OF BIRTH:  [birth year]  SUBJECTIVE:  CHIEF COMPLAINT:   Chief Complaint  Patient presents with   Fall   The patient has no complaints.  She has dementia. REVIEW OF SYSTEMS:  Review of Systems  Unable to perform ROS: Dementia    DRUG ALLERGIES:   Allergies  Allergen Reactions   Aspirin Other (See Comments)    On plavix.   Donepezil Other (See Comments)    Elevated bp   Metformin Other (See Comments)   Metformin And Related    VITALS:  Blood pressure (!) 148/64, pulse 72, temperature 98.3 F (36.8 C), temperature source Oral, resp. rate 18, height 5\' 2"  (1.575 m), weight 63 kg, SpO2 100 %. PHYSICAL EXAMINATION:  Physical Exam Constitutional:      General: She is not in acute distress.    Appearance: Normal appearance.  HENT:     Head: Normocephalic.     Mouth/Throat:     Mouth: Mucous membranes are moist.  Eyes:     General: No scleral icterus.    Conjunctiva/sclera: Conjunctivae normal.     Pupils: Pupils are equal, round, and reactive to light.  Neck:     Musculoskeletal: Normal range of motion and neck supple.     Vascular: No JVD.     Trachea: No tracheal deviation.  Cardiovascular:     Rate and Rhythm: Normal rate and regular rhythm.     Heart sounds: Normal heart sounds. No murmur. No gallop.   Pulmonary:     Effort: Pulmonary effort is normal. No respiratory distress.     Breath sounds: Normal breath sounds. No wheezing or rales.  Abdominal:     General: Bowel sounds are normal. There is no distension.     Palpations: Abdomen is soft.     Tenderness: There is no abdominal tenderness. There is no rebound.  Musculoskeletal: Normal range of motion.        General: No tenderness.     Right lower leg: No edema.     Left lower leg: No edema.  Skin:    Findings: No erythema or rash.  Neurological:     General: No focal deficit present.    Mental Status: She is alert.     Cranial Nerves: No cranial nerve deficit.     Comments: She is demented, follow commands.    LABORATORY PANEL:  Female CBC Recent Labs  Lab 09/06/18 0533  WBC 5.3  HGB 8.4*  HCT 26.6*  PLT 122*   ------------------------------------------------------------------------------------------------------------------ Chemistries  Recent Labs  Lab 09/05/18 1423 09/06/18 0533  NA 137 140  K 5.7* 4.7  CL 105 109  CO2 25 23  GLUCOSE 208* 95  BUN 50* 38*  CREATININE 1.31* 1.06*  CALCIUM 8.8* 8.5*  AST 35  --   ALT 26  --   ALKPHOS 107  --   BILITOT 0.7  --    RADIOLOGY:  Ct Head Wo Contrast  Result Date: 09/05/2018 CLINICAL DATA:  Polytrauma. Suspected head and cervical spine injury. EXAM: CT HEAD WITHOUT CONTRAST CT CERVICAL SPINE WITHOUT CONTRAST TECHNIQUE: Multidetector CT imaging of the head and cervical spine was performed following the standard protocol without intravenous contrast. Multiplanar CT image reconstructions of the cervical spine were also generated. COMPARISON:  CT 08/30/2018. FINDINGS: CT HEAD FINDINGS Brain: There is no evidence of acute intracranial hemorrhage, mass lesion, brain  edema or extra-axial fluid collection. The ventricles and subarachnoid spaces are appropriately sized for age. There is no CT evidence of acute cortical infarction. There are stable mild chronic small vessel ischemic changes in the periventricular white matter. Vascular: Intracranial vascular calcifications. No hyperdense vessel identified. Skull: Negative for fracture or focal lesion. Sinuses/Orbits: The visualized paranasal sinuses and mastoid air cells are clear. No orbital abnormalities are seen. Other: Interval improved left supraorbital scalp hematoma. CT CERVICAL SPINE FINDINGS Alignment: Stable mild convex left scoliosis. The lateral alignment is normal. Skull base and vertebrae: No evidence of acute cervical spine fracture or traumatic subluxation. Soft  tissues and spinal canal: No prevertebral fluid or swelling. No visible canal hematoma. Disc levels: Stable calcified pannus surrounding the odontoid process. There is stable spondylosis, greatest at C6-7. No high-grade spinal stenosis. Upper chest: Unremarkable. Other: Bilateral carotid atherosclerosis. IMPRESSION: 1. No acute intracranial or calvarial findings. 2. No evidence of acute cervical spine fracture, traumatic subluxation or static signs of instability. 3. Stable mild cervical spondylosis. Electronically Signed   By: Richardean Sale M.D.   On: 09/05/2018 15:30   Ct Chest W Contrast  Result Date: 09/05/2018 CLINICAL DATA:  Blunt abdominal trauma. History of dementia, diabetes and uterine cancer. EXAM: CT CHEST, ABDOMEN, AND PELVIS WITH CONTRAST TECHNIQUE: Multidetector CT imaging of the chest, abdomen and pelvis was performed following the standard protocol during bolus administration of intravenous contrast. CONTRAST:  75mL OMNIPAQUE IOHEXOL 300 MG/ML  SOLN COMPARISON:  CT abdomen pelvis 11/26/2014. FINDINGS: CT CHEST FINDINGS Cardiovascular: Atherosclerosis of the aorta, great vessels and coronary arteries. No acute vascular findings or evidence of mediastinal hematoma. The heart size is normal. There is no pericardial effusion. Mediastinum/Nodes: There are no enlarged mediastinal, hilar or axillary lymph nodes. There is a stable small hiatal hernia. The trachea and thyroid gland appear unremarkable. Lungs/Pleura: There is no pleural effusion or pneumothorax. There is mild emphysema with dependent atelectasis in both lungs. 3 mm subpleural left upper lobe nodule on image 76/5 is likely a lymph node. No suspicious nodules. Musculoskeletal/Chest wall: No chest wall mass or suspicious osseous findings. CT ABDOMEN AND PELVIS FINDINGS Hepatobiliary: The liver is normal in density without suspicious focal abnormality. No evidence of gallstones, gallbladder wall thickening or biliary dilatation. Pancreas:  Unremarkable. No pancreatic ductal dilatation or surrounding inflammatory changes. Spleen: Normal in size without focal abnormality. Adrenals/Urinary Tract: Both adrenal glands appear normal. 17 mm exophytic lesion involving the lower pole of the left kidney (image 74/2) is unchanged in size from the 2016 CT. This measures higher than water density, although is presumably a complex cysts based on stability. There is mild renal cortical thinning bilaterally. No hydronephrosis or perinephric fluid collection. The bladder appears normal. Stomach/Bowel: No evidence of bowel wall thickening, distention or surrounding inflammatory change. Moderate stool throughout the colon. Vascular/Lymphatic: There are no enlarged abdominal or pelvic lymph nodes. Moderate diffuse aortic and branch vessel atherosclerosis without acute vascular findings. Reproductive: Hysterectomy.  No adnexal mass. Other: There is new mild soft tissue stranding in the perirectal fat. No evidence of pelvic hematoma, focal fluid collection or free air. Peripherally calcified nodules in the pelvic fat, likely fat necrosis. Musculoskeletal: No acute or significant osseous findings. Chronic bilateral common hamstring enthesopathic changes. IMPRESSION: 1. No acute posttraumatic findings identified in the chest, abdomen or pelvis. 2. Mild soft tissue stranding in the perirectal fat, likely inflammatory. No associated bowel wall thickening or obstruction. 3. Diffuse Aortic Atherosclerosis (ICD10-I70.0). 4. Previously described left renal lesion is stable  in size, presumably a complex cyst based on stability from 2016. Electronically Signed   By: Richardean Sale M.D.   On: 09/05/2018 15:45   Ct Cervical Spine Wo Contrast  Result Date: 09/05/2018 CLINICAL DATA:  Polytrauma. Suspected head and cervical spine injury. EXAM: CT HEAD WITHOUT CONTRAST CT CERVICAL SPINE WITHOUT CONTRAST TECHNIQUE: Multidetector CT imaging of the head and cervical spine was performed  following the standard protocol without intravenous contrast. Multiplanar CT image reconstructions of the cervical spine were also generated. COMPARISON:  CT 08/30/2018. FINDINGS: CT HEAD FINDINGS Brain: There is no evidence of acute intracranial hemorrhage, mass lesion, brain edema or extra-axial fluid collection. The ventricles and subarachnoid spaces are appropriately sized for age. There is no CT evidence of acute cortical infarction. There are stable mild chronic small vessel ischemic changes in the periventricular white matter. Vascular: Intracranial vascular calcifications. No hyperdense vessel identified. Skull: Negative for fracture or focal lesion. Sinuses/Orbits: The visualized paranasal sinuses and mastoid air cells are clear. No orbital abnormalities are seen. Other: Interval improved left supraorbital scalp hematoma. CT CERVICAL SPINE FINDINGS Alignment: Stable mild convex left scoliosis. The lateral alignment is normal. Skull base and vertebrae: No evidence of acute cervical spine fracture or traumatic subluxation. Soft tissues and spinal canal: No prevertebral fluid or swelling. No visible canal hematoma. Disc levels: Stable calcified pannus surrounding the odontoid process. There is stable spondylosis, greatest at C6-7. No high-grade spinal stenosis. Upper chest: Unremarkable. Other: Bilateral carotid atherosclerosis. IMPRESSION: 1. No acute intracranial or calvarial findings. 2. No evidence of acute cervical spine fracture, traumatic subluxation or static signs of instability. 3. Stable mild cervical spondylosis. Electronically Signed   By: Richardean Sale M.D.   On: 09/05/2018 15:30   Ct Abdomen Pelvis W Contrast  Result Date: 09/05/2018 CLINICAL DATA:  Blunt abdominal trauma. History of dementia, diabetes and uterine cancer. EXAM: CT CHEST, ABDOMEN, AND PELVIS WITH CONTRAST TECHNIQUE: Multidetector CT imaging of the chest, abdomen and pelvis was performed following the standard protocol during  bolus administration of intravenous contrast. CONTRAST:  46mL OMNIPAQUE IOHEXOL 300 MG/ML  SOLN COMPARISON:  CT abdomen pelvis 11/26/2014. FINDINGS: CT CHEST FINDINGS Cardiovascular: Atherosclerosis of the aorta, great vessels and coronary arteries. No acute vascular findings or evidence of mediastinal hematoma. The heart size is normal. There is no pericardial effusion. Mediastinum/Nodes: There are no enlarged mediastinal, hilar or axillary lymph nodes. There is a stable small hiatal hernia. The trachea and thyroid gland appear unremarkable. Lungs/Pleura: There is no pleural effusion or pneumothorax. There is mild emphysema with dependent atelectasis in both lungs. 3 mm subpleural left upper lobe nodule on image 76/5 is likely a lymph node. No suspicious nodules. Musculoskeletal/Chest wall: No chest wall mass or suspicious osseous findings. CT ABDOMEN AND PELVIS FINDINGS Hepatobiliary: The liver is normal in density without suspicious focal abnormality. No evidence of gallstones, gallbladder wall thickening or biliary dilatation. Pancreas: Unremarkable. No pancreatic ductal dilatation or surrounding inflammatory changes. Spleen: Normal in size without focal abnormality. Adrenals/Urinary Tract: Both adrenal glands appear normal. 17 mm exophytic lesion involving the lower pole of the left kidney (image 74/2) is unchanged in size from the 2016 CT. This measures higher than water density, although is presumably a complex cysts based on stability. There is mild renal cortical thinning bilaterally. No hydronephrosis or perinephric fluid collection. The bladder appears normal. Stomach/Bowel: No evidence of bowel wall thickening, distention or surrounding inflammatory change. Moderate stool throughout the colon. Vascular/Lymphatic: There are no enlarged abdominal or pelvic lymph nodes.  Moderate diffuse aortic and branch vessel atherosclerosis without acute vascular findings. Reproductive: Hysterectomy.  No adnexal mass.  Other: There is new mild soft tissue stranding in the perirectal fat. No evidence of pelvic hematoma, focal fluid collection or free air. Peripherally calcified nodules in the pelvic fat, likely fat necrosis. Musculoskeletal: No acute or significant osseous findings. Chronic bilateral common hamstring enthesopathic changes. IMPRESSION: 1. No acute posttraumatic findings identified in the chest, abdomen or pelvis. 2. Mild soft tissue stranding in the perirectal fat, likely inflammatory. No associated bowel wall thickening or obstruction. 3. Diffuse Aortic Atherosclerosis (ICD10-I70.0). 4. Previously described left renal lesion is stable in size, presumably a complex cyst based on stability from 2016. Electronically Signed   By: Richardean Sale M.D.   On: 09/05/2018 15:45   ASSESSMENT AND PLAN:   85 year old female patient with a known history of dementia, chronic congestive heart failure, type 2 diabetes mellitus, hyperlipidemia, hypertension is a resident of [facility] facility presented to emergency room for fall  -Acute hyperkalemia Could be secondary to renal insufficiency Improved with Kayexalate 15 g orally 1 dose and IV fluid.  -Acute kidney injury Hold diuretics Continue IV fluid hydration, follow-up BMP.  -Acute rhabdomyolysis Continue IV fluid hydration follow-up CK level Secondary to fall  -Dementia Supportive care  Diabetes mellitus Diabetic diet with sliding scale coverage with insulin  Generalized weakness.  PT evaluation suggest home health and PT. All the records are reviewed and case discussed with Care Management/Social Worker. Management plans discussed with the patient, family and they are in agreement.  CODE STATUS: Full Code  TOTAL TIME TAKING CARE OF THIS PATIENT: 32 minutes.   More than 50% of the time was spent in counseling/coordination of care: YES  POSSIBLE D/C IN 1-2 DAYS, DEPENDING ON CLINICAL CONDITION.   Demetrios Loll M.D on 09/06/2018 at 12:06  PM  Between 7am to 6pm - Pager - [phone]  After 6pm go to [email or web address] - [facility]

## 2018-09-06 NOTE — Evaluation (Signed)
Physical Therapy Evaluation Patient Details Name: Deborah Case MRN: [id number] DOB: December 29, 1929 Today's Date: 09/06/2018   History of Present Illness  Deborah Case is a 85 year old female who presented to hospital ER from [facility] memory care unit via EMS on 09/05/2018 s/p 2nd fall in one week (previously came to ER and discharged) complaining of pain in right elbow, pain to lateral rib area, and multiple new and old bruises. She hit her head and left knee with the first fall earlier in the week. Patient has history of alzheimers and was unable to provide reliable history, so information obtained from review of previous documentation. In the ER she was diagnosed with a fall, decreased hemoglobin, hyperkalemia, acute renal failure, and traumatic rhabdomyolysis. She was subsequently admitted to the hospital with diagnosis of acute hyperkalemia, acute kdney injury, acute rhabdomyolysis, uncontrolled diabetes mellitus, and dementia. Relevant PMH include uterine cancer, CHF, dementia, DMII, HTN, pulmonary embolism, and thyroid disease. X-rays and CT scans without any concerning acute findings.    Clinical Impression  Prior to hospital admission, pt lived in memory care unit and experienced 2 falls in one week while ambulating. She is not a reliable historian and was unable to provide information about her PLOF, so information was obtained through chart review. Upon physical therapy evaluation, pt demo poor safety awareness, confusion, and difficulty following commands. She required min A with bed mobility, sit <> stand transfers, and ambulation with RW due to limitations including poor safety awareness, difficulty with problem solving, and difficulty following or remembering cuing for safe mobility. Patient repeatedly attempted to pull walker towards herself when attempting to stand, resulting in it falling towards her lap, was unable to turn 180 degrees to sit safely while using RW, and failed to keep RW  close to body or hold it properly with ambulation. Pt was unsteady and at risk for falling without UE support in standing. She required significant cuing to stay on task and improve body and AD positioning for safe weight bearing functional mobility. Patient would benefit from continued physical therapy interventions to address her current impairments and functional deficits and return to PLOF or maximal functional ability.     Follow Up Recommendations Home health PT;Supervision/Assistance - 24 hour    Equipment Recommendations  Rolling walker with 5" wheels    Recommendations for Other Services       Precautions / Restrictions Precautions Precautions: Fall;Other (comment) Precaution Comments: disoriented Restrictions Weight Bearing Restrictions: No      Mobility  Bed Mobility Overal bed mobility: Needs Assistance Bed Mobility: Supine to Sit     Supine to sit: Min assist     General bed mobility comments: required min A to move blankets, cuing and encouragement to get to edge of bed.   Transfers Overall transfer level: Needs assistance Equipment used: Rolling walker (2 wheeled) Transfers: Sit to/from Stand Sit to Stand: Min assist         General transfer comment: completed sit <> stand from edge of bed x 3 trials, from/to BSC and to chair using RW. Req min A to for most trials to keep from pulling RW onto self and cuing for improved hand placement and shift forward.   Ambulation/Gait Ambulation/Gait assistance: Min guard Gait Distance (Feet): 240 Feet Assistive device: Rolling walker (2 wheeled) Gait Pattern/deviations: Trunk flexed;Decreased stride length     General Gait Details: demo forward stoop, failed to keep RW close to body stepping outside of RW at times, unable to problem  solve to complete 180 degree turn, got very confused about hand placement on RW at one point, unable to locate room. Improved deficits with repeated and strong cuing.   Stairs             Wheelchair Mobility    Modified Rankin (Stroke Patients Only)       Balance Overall balance assessment: Needs assistance Sitting-balance support: No upper extremity supported Sitting balance-Leahy Scale: Good     Standing balance support: Bilateral upper extremity supported Standing balance-Leahy Scale: Good                               Pertinent Vitals/Pain Pain Assessment: Faces Faces Pain Scale: Hurts little more Pain Location: right shoulder/axilla, left knee Pain Intervention(s): Monitored during session;Limited activity within patient's tolerance    Home Living Family/patient expects to be discharged to:: Other (Comment)(memory unit in long term care; [facility])                      Prior Function Level of Independence: Needs assistance         Comments: in memory care, apparently ambulated prior to admission. Unclear if she used AD or not. Pt not a reliable historian, unable to obtain baseline from her.      Hand Dominance        Extremity/Trunk Assessment   Upper Extremity Assessment Upper Extremity Assessment: Generalized weakness    Lower Extremity Assessment Lower Extremity Assessment: Generalized weakness    Cervical / Trunk Assessment Cervical / Trunk Assessment: Other exceptions Cervical / Trunk Exceptions: reports pain in R axilary region that improves when gait belt is placed and snug  Communication   Communication: Other (comment)(disoriented to situation. Treats clinician as if she is someone familiar to pt)  Cognition Arousal/Alertness: Awake/alert Behavior During Therapy: (confused to situation but cooperative) Overall Cognitive Status: History of cognitive impairments - at baseline                                 General Comments: history of alzheimers according to chart review lives in [place] care.       General Comments      Exercises Other Exercises Other Exercises:  practiced proper transfer techniques to improve body and AD positioning and decrease fall risk.    Assessment/Plan    PT Assessment Patient needs continued PT services  PT Problem List Decreased strength;Decreased safety awareness;Decreased mobility;Decreased knowledge of precautions;Decreased activity tolerance;Decreased cognition;Decreased balance;Decreased knowledge of use of DME;Pain       PT Treatment Interventions DME instruction;Functional mobility training;Gait training;Therapeutic activities;Neuromuscular re-education;Balance training;Patient/family education;Therapeutic exercise;Stair training    PT Goals (Current goals can be found in the Care Plan section)  Acute Rehab PT Goals Patient Stated Goal: return home PT Goal Formulation: With patient Time For Goal Achievement: 09/20/18 Potential to Achieve Goals: Good    Frequency Min 2X/week   Barriers to discharge Decreased caregiver support      Co-evaluation               AM-PAC PT "6 Clicks" Mobility  Outcome Measure Help needed turning from your back to your side while in a flat bed without using bedrails?: A Little Help needed moving from lying on your back to sitting on the side of a flat bed without using bedrails?: A Little Help needed moving to and  from a bed to a chair (including a wheelchair)?: A Little Help needed standing up from a chair using your arms (e.g., wheelchair or bedside chair)?: A Little Help needed to walk in hospital room?: A Little Help needed climbing 3-5 steps with a railing? : Total 6 Click Score: 16    End of Session Equipment Utilized During Treatment: Gait belt Activity Tolerance: Patient tolerated treatment well Patient left: in chair;with SCD's reapplied;with call bell/phone within reach;with chair alarm set Nurse Communication: Mobility status(results of session) PT Visit Diagnosis: Unsteadiness on feet (R26.81);Muscle weakness (generalized) (M62.81);History of falling  (Z91.81);Difficulty in walking, not elsewhere classified (R26.2)    Time: 1000-1040 PT Time Calculation (min) (ACUTE ONLY): 40 min   Charges:   PT Evaluation $PT Eval Moderate Complexity: 1 Mod PT Treatments $Therapeutic Activity: 23-37 mins       Everlean Alstrom. Graylon Good, PT, DPT 09/06/18, 11:17 AM

## 2018-09-06 NOTE — NC FL2 (Signed)
[place] LEVEL OF CARE SCREENING TOOL     IDENTIFICATION  Patient Name: Deborah Case Birthdate: 1929-10-19 Sex: female Admission Date (Current Location): 09/05/2018  [place] and [place] Number:  Engineering geologist and Address:  [facility], [address] [place], [place] [address]      Provider Number: [id number]  Attending Physician Name and Address:  Demetrios Loll, MD  Relative Name and Phone Number:       Current Level of Care: Hospital Recommended Level of Care: Memory Care Prior Approval Number:    Date Approved/Denied:   PASRR Number:    Discharge Plan: Other (Comment)(Memory Care )    Current Diagnoses: Patient Active Problem List   Diagnosis Date Noted  . AKI (acute kidney injury) ([place]) 09/05/2018    Orientation RESPIRATION BLADDER Height & Weight     Self  Normal Continent Weight: 138 lb 14.2 oz (63 kg) Height:  5\' 2"  (157.5 cm)  BEHAVIORAL SYMPTOMS/MOOD NEUROLOGICAL BOWEL NUTRITION STATUS  (none) (none) Continent Diet(Heart Healthy )  AMBULATORY STATUS COMMUNICATION OF NEEDS Skin   Limited Assist Verbally Normal                       Personal Care Assistance Level of Assistance  Bathing, Feeding, Dressing Bathing Assistance: Limited assistance Feeding assistance: Independent Dressing Assistance: Limited assistance     Functional Limitations Info  Sight, Hearing, Speech Sight Info: Adequate Hearing Info: Adequate Speech Info: Adequate    SPECIAL CARE FACTORS FREQUENCY                       Contractures Contractures Info: Not present    Additional Factors Info  Code Status, Allergies Code Status Info: Full Code  Allergies Info: Aspirin, Donepezil, Metformin, Metformin And Related           Current Medications (09/06/2018):  This is the current hospital active medication list Current Facility-Administered Medications  Medication Dose Route Frequency Provider Last Rate Last Dose  .  0.9 %  sodium chloride infusion   Intravenous Continuous Saundra Shelling, MD 75 mL/hr at 09/06/18 6195    . acetaminophen (TYLENOL) tablet 650 mg  650 mg Oral Q6H PRN Saundra Shelling, MD   650 mg at 09/06/18 0605   Or  . acetaminophen (TYLENOL) suppository 650 mg  650 mg Rectal Q6H PRN Saundra Shelling, MD      . atorvastatin (LIPITOR) tablet 80 mg  80 mg Oral QHS Saundra Shelling, MD   80 mg at 09/05/18 2140  . bisacodyl (DULCOLAX) EC tablet 5 mg  5 mg Oral Daily PRN Demetrios Loll, MD      . carvedilol (COREG) tablet 12.5 mg  12.5 mg Oral Daily Demetrios Loll, MD   12.5 mg at 09/06/18 0802  . clopidogrel (PLAVIX) tablet 75 mg  75 mg Oral Daily Pyreddy, Reatha Harps, MD   75 mg at 09/06/18 0802  . guaiFENesin (ROBITUSSIN) 100 MG/5ML solution 200 mg  200 mg Oral Q6H PRN Saundra Shelling, MD      . heparin injection 5,000 Units  5,000 Units Subcutaneous Q8H Saundra Shelling, MD   5,000 Units at 09/06/18 0604  . hydrALAZINE (APRESOLINE) injection 10 mg  10 mg Intravenous Q6H PRN Demetrios Loll, MD      . insulin aspart (novoLOG) injection 0-5 Units  0-5 Units Subcutaneous QHS Demetrios Loll, MD      . insulin aspart (novoLOG) injection 0-9 Units  0-9 Units  Subcutaneous TID WC Demetrios Loll, MD   1 Units at 09/06/18 1141  . levothyroxine (SYNTHROID, LEVOTHROID) tablet 88 mcg  88 mcg Oral Q0600 Saundra Shelling, MD   88 mcg at 09/06/18 0605  . lisinopril (PRINIVIL,ZESTRIL) tablet 10 mg  10 mg Oral Daily Pyreddy, Reatha Harps, MD   10 mg at 09/06/18 0801  . ondansetron (ZOFRAN) tablet 4 mg  4 mg Oral Q6H PRN Pyreddy, Reatha Harps, MD       Or  . ondansetron (ZOFRAN) injection 4 mg  4 mg Intravenous Q6H PRN Pyreddy, Pavan, MD      . senna-docusate (Senokot-S) tablet 1 tablet  1 tablet Oral QHS PRN Saundra Shelling, MD         Discharge Medications: Please see discharge summary for a list of discharge medications.  Relevant Imaging Results:  Relevant Lab Results:   Additional Information    Robbin Escher  Louretta Shorten, LCSWA

## 2018-09-06 NOTE — TOC Initial Note (Signed)
Transition of Care [facility]) - Initial/Assessment Note    Patient Details  Name: TOMA ERICHSEN MRN: [id number] Date of Birth: Sep 22, 1929  [facility]) CM/SW Contact:    Annamaria Boots, [place] Phone Number: 09/06/2018, 11:34 AM  Clinical Narrative:    CSW found through chart review that patient is from [place] living. CSW attempted to meet with patient but she is confused and unable to complete assessment. CSW left voicemail for patient's legal guardian Lollie Sails [phone]. CSW will continue to follow for discharge planning.                Expected Discharge Plan: Rest Home Barriers to Discharge: Continued Medical Work up   Patient Goals and CMS Choice   CMS Medicare.gov Compare Post Acute Care list provided to:: Legal Guardian Choice offered to / list presented to : [facility] POA / Guardian  Expected Discharge Plan and Services Expected Discharge Plan: Rest Home       Living arrangements for the past 2 months: [place]                          Prior Living Arrangements/Services Living arrangements for the past 2 months: [place] Lives with:: Facility Resident Patient language and need for interpreter reviewed:: Yes Do you feel safe going back to the place where you live?: Yes      Need for Family Participation in Patient Care: Yes (Comment) Care giver support system in place?: Yes (comment)   Criminal Activity/Legal Involvement Pertinent to Current Situation/Hospitalization: No - Comment as needed  Activities of Daily Living Home Assistive Devices/Equipment: Other (Comment)(from [facility])    Permission Sought/Granted                  Emotional Assessment Appearance:: Appears stated age   Affect (typically observed): Restless, Quiet Orientation: : Oriented to Self Alcohol / Substance Use: Not Applicable Psych Involvement: No (comment)  Admission diagnosis:  Hyperkalemia [E87.5] Decreased  hemoglobin [R71.0] Fall, initial encounter [W19.XXXA] Traumatic rhabdomyolysis, initial encounter ([place]) [T79.6XXA] Acute renal failure, unspecified acute renal failure type [facility]) [N17.9] Patient Active Problem List   Diagnosis Date Noted  . AKI (acute kidney injury) ([place]) 09/05/2018   PCP:  Hortencia Pilar, MD Pharmacy:   [facility], [place] [address] [place] [address] Phone: [phone] Fax: [phone]     Social Determinants of Health (SDOH) Interventions    Readmission Risk Interventions No flowsheet data found.

## 2018-09-06 NOTE — Progress Notes (Signed)
Pt. Not being cooperative with care.  Pulled out IV and refuses to keep telemetry on.  Telemetry contacted and put Pt. On standby.  MD notified and ordered seroquel will attempt IV once Pt. Settles down.

## 2018-09-07 ENCOUNTER — Inpatient Hospital Stay: Payer: Medicare Other

## 2018-09-07 LAB — GLUCOSE, CAPILLARY
Glucose-Capillary: 86 mg/dL (ref 70–99)
Glucose-Capillary: 171 mg/dL — ABNORMAL HIGH (ref 70–99)
Glucose-Capillary: 142 mg/dL — ABNORMAL HIGH (ref 70–99)

## 2018-09-07 LAB — BASIC METABOLIC PANEL
Anion gap: 6 (ref 5–15)
BUN: 41 mg/dL — ABNORMAL HIGH (ref 8–23)
CO2: 24 mmol/L (ref 22–32)
Calcium: 8.6 mg/dL — ABNORMAL LOW (ref 8.9–10.3)
Chloride: 109 mmol/L (ref 98–111)
Creatinine, Ser: 1.15 mg/dL — ABNORMAL HIGH (ref 0.44–1.00)
GFR calc Af Amer: 50 mL/min — ABNORMAL LOW (ref >60)
GFR calc non Af Amer: 43 mL/min — ABNORMAL LOW (ref >60)
Glucose, Bld: 88 mg/dL (ref 70–99)
Potassium: 4.8 mmol/L (ref 3.5–5.1)
Sodium: 139 mmol/L (ref 135–145)

## 2018-09-07 LAB — CK: Total CK: 429 U/L — ABNORMAL HIGH (ref 38–234)

## 2018-09-07 MED ORDER — QUETIAPINE FUMARATE 25 MG PO TABS
25.0000 mg | ORAL_TABLET | Freq: Every day | ORAL | Status: DC
  Administered 2018-09-08 – 2018-09-09 (×2): 25 mg via ORAL
  Filled 2018-09-07 (×2): qty 1

## 2018-09-07 MED ORDER — QUETIAPINE FUMARATE 25 MG PO TABS
50.0000 mg | ORAL_TABLET | Freq: Every day | ORAL | Status: DC
  Administered 2018-09-07 – 2018-09-08 (×2): 50 mg via ORAL
  Filled 2018-09-07 (×2): qty 2

## 2018-09-07 NOTE — Progress Notes (Signed)
Physical Therapy Treatment Patient Details Name: Deborah Case MRN: [id number] DOB: [birth year] Today's Date: 09/07/2018    History of Present Illness Deborah Case is a 85 year old female who presented to hospital ER from [facility] memory care unit via EMS on 09/05/2018 s/p 2nd fall in one week (previously came to ER and discharged) complaining of pain in right elbow, pain to lateral rib area, and multiple new and old bruises. She hit her head and left knee with the first fall earlier in the week. Patient has history of alzheimers and was unable to provide reliable history, so information obtained from review of previous documentation. In the ER she was diagnosed with a fall, decreased hemoglobin, hyperkalemia, acute renal failure, and traumatic rhabdomyolysis. She was subsequently admitted to the hospital with diagnosis of acute hyperkalemia, acute kdney injury, acute rhabdomyolysis, uncontrolled diabetes mellitus, and dementia. Relevant PMH include uterine cancer, CHF, dementia, DMII, HTN, pulmonary embolism, and thyroid disease. X-rays and CT scans without any concerning acute findings.    PT Comments    Patient pleasantly confused and agreed to participate in physical therapy this morning with some encouragement. Reports no pain at baseline but complains of right shoulder pain when lifting right arm for gait belt. Oriented to self only. Patient tolerated treatment well and is making progress towards goals at this point. She was able to ambulate further 520 feet with min A- contact guard assist and showed mild improvement in AD and body positioning. Better able to follow cuing for safe 180 degree turn to sit in chair using RW. Attempted standing without RW and very unsteady, so resumed use of RW. Completed sit <> stand from chair x 2 trials with min A for steadying. Vitals remained WFL throughout session. Continues to fail to properly position AD, attempts to step outside of it and fails to maintain RW  close to walker. Will benefit from further physical therapy to work towards stated goals and return to PLOF or maximal functional independence.     Follow Up Recommendations  Home health PT;Supervision/Assistance - 24 hour     Equipment Recommendations  Rolling walker with 5" wheels    Recommendations for Other Services       Precautions / Restrictions Precautions Precautions: Fall;Other (comment) Precaution Comments: disoriented Restrictions Weight Bearing Restrictions: No    Mobility  Bed Mobility               General bed mobility comments: already up in chair  Transfers Overall transfer level: Needs assistance Equipment used: Rolling walker (2 wheeled) Transfers: Sit to/from Stand Sit to Stand: Min assist         General transfer comment: sit <> stand from chair x 2 trials with min A to prevent loss of balance. Less steady than last treatment session with momentary losses of balance backwards. First trial without RW second trial to RW with improved balance. Require cuing for hand and body placment.   Ambulation/Gait Ambulation/Gait assistance: Min assist;Min guard Gait Distance (Feet): 520 Feet Assistive device: Rolling walker (2 wheeled) Gait Pattern/deviations: Trunk flexed;Decreased stride length Gait velocity: 1 ft/sec Gait velocity interpretation: <1.8 ft/sec, indicate of risk for recurrent falls General Gait Details: demo forward stoop, failed to keep RW close to body stepping outside of RW at times, but improved with cuing. At first demo intermittant sudden loss of balance requiring min A to prevent fall and occasional min A to help guide walker in tight quarters. Otherwise maintained balance with CGA.  Stairs             Wheelchair Mobility    Modified Rankin (Stroke Patients Only)       Balance Overall balance assessment: Needs assistance Sitting-balance support: No upper extremity supported Sitting balance-Leahy Scale: Good      Standing balance support: Bilateral upper extremity supported Standing balance-Leahy Scale: Fair Standing balance comment: Had intermittant momentary loss of balance in standing at start of visit that required min A to prevent LOB. Improved with cuing and towards end of session.                             Cognition Arousal/Alertness: Awake/alert Behavior During Therapy: (confused to situation but cooperative) Overall Cognitive Status: History of cognitive impairments - at baseline                                 General Comments: history of alzheimers according to chart review lives in [place] care.       Exercises Other Exercises Other Exercises: practiced proper transfer techniques to improve body and AD positioning and decrease fall risk.     General Comments        Pertinent Vitals/Pain      Home Living                      Prior Function            PT Goals (current goals can now be found in the care plan section) Acute Rehab PT Goals Patient Stated Goal: return home PT Goal Formulation: With patient Time For Goal Achievement: 09/20/18 Potential to Achieve Goals: Good Progress towards PT goals: Progressing toward goals    Frequency    Min 2X/week      PT Plan Current plan remains appropriate    Co-evaluation              AM-PAC PT "6 Clicks" Mobility   Outcome Measure  Help needed turning from your back to your side while in a flat bed without using bedrails?: A Little Help needed moving from lying on your back to sitting on the side of a flat bed without using bedrails?: A Little Help needed moving to and from a bed to a chair (including a wheelchair)?: A Little Help needed standing up from a chair using your arms (e.g., wheelchair or bedside chair)?: A Little Help needed to walk in hospital room?: A Little Help needed climbing 3-5 steps with a railing? : Total 6 Click Score: 16    End of Session Equipment  Utilized During Treatment: Gait belt Activity Tolerance: Patient tolerated treatment well Patient left: in chair;with call bell/phone within reach;with chair alarm set(with x-ray tech in room attending to pt) Nurse Communication: Mobility status(results of session) PT Visit Diagnosis: Unsteadiness on feet (R26.81);Muscle weakness (generalized) (M62.81);History of falling (Z91.81);Difficulty in walking, not elsewhere classified (R26.2)     Time: 3491-7915 PT Time Calculation (min) (ACUTE ONLY): 28 min  Charges:  $Gait Training: 8-22 mins $Therapeutic Activity: 8-22 mins                     Everlean Alstrom. Graylon Good, PT, DPT 09/07/18, 10:19 AM

## 2018-09-07 NOTE — Progress Notes (Signed)
1:1 safety sitter ordered fro pt, no sitters available at this time. Pt still continues to get up from bed and can not be redirected. Pt in [place] and brought to nurses desk

## 2018-09-07 NOTE — Progress Notes (Signed)
Pt slip to floor, found by Tiffany RN from 1A. Chair alarm on. Called Dr. Bridgett Larsson. And notified pt's legal guardian at [place]. Lollie Sails does not take phone calls. report of social worker Ebony. She stated she would notify family.

## 2018-09-07 NOTE — Progress Notes (Signed)
[place] at [place] NAME: Deborah Case    MR#:  [id number]  DATE OF BIRTH:  1930/04/22  SUBJECTIVE:  CHIEF COMPLAINT:   Chief Complaint  Patient presents with  . Fall   The patient has no complaints.  She has dementia.  She is confused. REVIEW OF SYSTEMS:  Review of Systems  Unable to perform ROS: Dementia    DRUG ALLERGIES:   Allergies  Allergen Reactions  . Aspirin Other (See Comments)    On plavix.  . Donepezil Other (See Comments)    Elevated bp  . Metformin Other (See Comments)  . Metformin And Related    VITALS:  Blood pressure (!) 161/54, pulse 67, temperature (!) 97.4 F (36.3 C), temperature source Oral, resp. rate 17, height 5\' 2"  (1.575 m), weight 63 kg, SpO2 97 %. PHYSICAL EXAMINATION:  Physical Exam Constitutional:      General: She is not in acute distress.    Appearance: Normal appearance.  HENT:     Head: Normocephalic.     Mouth/Throat:     Mouth: Mucous membranes are moist.  Eyes:     General: No scleral icterus.    Conjunctiva/sclera: Conjunctivae normal.     Pupils: Pupils are equal, round, and reactive to light.  Neck:     Musculoskeletal: Normal range of motion and neck supple.     Vascular: No JVD.     Trachea: No tracheal deviation.  Cardiovascular:     Rate and Rhythm: Normal rate and regular rhythm.     Heart sounds: Normal heart sounds. No murmur. No gallop.   Pulmonary:     Effort: Pulmonary effort is normal. No respiratory distress.     Breath sounds: Normal breath sounds. No wheezing or rales.  Abdominal:     General: Bowel sounds are normal. There is no distension.     Palpations: Abdomen is soft.     Tenderness: There is no abdominal tenderness. There is no rebound.  Musculoskeletal: Normal range of motion.        General: No tenderness.     Right lower leg: No edema.     Left lower leg: No edema.  Skin:    Findings: No erythema or rash.  Neurological:     General: No focal  deficit present.     Mental Status: She is alert.     Cranial Nerves: No cranial nerve deficit.     Comments: She is demented, follow commands.    LABORATORY PANEL:  Female CBC Recent Labs  Lab 09/06/18 0533  WBC 5.3  HGB 8.4*  HCT 26.6*  PLT 122*   ------------------------------------------------------------------------------------------------------------------ Chemistries  Recent Labs  Lab 09/05/18 1423  09/07/18 0346  NA 137   < > 139  K 5.7*   < > 4.8  CL 105   < > 109  CO2 25   < > 24  GLUCOSE 208*   < > 88  BUN 50*   < > 41*  CREATININE 1.31*   < > 1.15*  CALCIUM 8.8*   < > 8.6*  AST 35  --   --   ALT 26  --   --   ALKPHOS 107  --   --   BILITOT 0.7  --   --    < > = values in this interval not displayed.   RADIOLOGY:  Dg Shoulder Right  Result Date: 09/07/2018 CLINICAL DATA:  Pain with lifting. EXAM: RIGHT  SHOULDER - 2+ VIEW COMPARISON:  None. FINDINGS: Degenerative changes in the [facility] joint with joint space narrowing and spurring. Glenohumeral joint is maintained. No acute bony abnormality. Specifically, no fracture, subluxation, or dislocation. Soft tissues are intact. IMPRESSION: Degenerative changes in the right AC joint. No acute bony abnormality. Electronically Signed   By: Rolm Baptise M.D.   On: 09/07/2018 10:32   ASSESSMENT AND PLAN:   85 year old female patient with a known history of dementia, chronic congestive heart failure, type 2 diabetes mellitus, hyperlipidemia, hypertension is a resident of [place] facility presented to emergency room for fall  -Acute hyperkalemia Could be secondary to renal insufficiency Improved with Kayexalate 15 g orally 1 dose and IV fluid.  -Acute kidney injury Hold diuretics She is rehydrated with IV fluid, follow-up BMP.  -Acute rhabdomyolysis Improving with IV fluid hydration. Secondary to fall  Hypertension.  Increased Coreg and continue lisinopril.  -Dementia Supportive care  Diabetes mellitus  Diabetic diet with sliding scale coverage with insulin  Generalized weakness.  PT evaluation suggest home health and PT. All the records are reviewed and case discussed with Care Management/Social Worker. Management plans discussed with the patient, family and they are in agreement.  CODE STATUS: Full Code  TOTAL TIME TAKING CARE OF THIS PATIENT: 26 minutes.   More than 50% of the time was spent in counseling/coordination of care: YES  POSSIBLE D/C IN 1-2 DAYS, DEPENDING ON CLINICAL CONDITION.   Demetrios Loll M.D on 09/07/2018 at 4:35 PM  Between 7am to 6pm - Pager - [phone]  After 6pm go to [email or web address] - [place] [facility]

## 2018-09-08 LAB — GLUCOSE, CAPILLARY
Glucose-Capillary: 121 mg/dL — ABNORMAL HIGH (ref 70–99)
Glucose-Capillary: 126 mg/dL — ABNORMAL HIGH (ref 70–99)
Glucose-Capillary: 139 mg/dL — ABNORMAL HIGH (ref 70–99)
Glucose-Capillary: 141 mg/dL — ABNORMAL HIGH (ref 70–99)
Glucose-Capillary: 98 mg/dL (ref 70–99)

## 2018-09-08 LAB — BASIC METABOLIC PANEL
Anion gap: 7 (ref 5–15)
BUN: 43 mg/dL — ABNORMAL HIGH (ref 8–23)
CO2: 25 mmol/L (ref 22–32)
Calcium: 8.8 mg/dL — ABNORMAL LOW (ref 8.9–10.3)
Chloride: 109 mmol/L (ref 98–111)
Creatinine, Ser: 1.23 mg/dL — ABNORMAL HIGH (ref 0.44–1.00)
GFR calc Af Amer: 46 mL/min — ABNORMAL LOW (ref >60)
GFR calc non Af Amer: 40 mL/min — ABNORMAL LOW (ref >60)
Glucose, Bld: 110 mg/dL — ABNORMAL HIGH (ref 70–99)
Potassium: 4.8 mmol/L (ref 3.5–5.1)
Sodium: 141 mmol/L (ref 135–145)

## 2018-09-08 MED ORDER — SODIUM CHLORIDE 0.9 % IV SOLN: INTRAVENOUS | Status: AC

## 2018-09-08 NOTE — Progress Notes (Signed)
Physical Therapy Treatment Patient Details Name: IRIANA ARTLEY MRN: [id number] DOB: 08/08/1929 Today's Date: 09/08/2018    History of Present Illness Deborah Case is a 85 year old female who presented to hospital ER from [facility] memory care unit via EMS on 09/05/2018 s/p 2nd fall in one week (previously came to ER and discharged) complaining of pain in right elbow, pain to lateral rib area, and multiple new and old bruises. She hit her head and left knee with the first fall earlier in the week. Patient has history of alzheimers and was unable to provide reliable history, so information obtained from review of previous documentation. In the ER she was diagnosed with a fall, decreased hemoglobin, hyperkalemia, acute renal failure, and traumatic rhabdomyolysis. She was subsequently admitted to the hospital with diagnosis of acute hyperkalemia, acute kdney injury, acute rhabdomyolysis, uncontrolled diabetes mellitus, and dementia. Relevant PMH include uterine cancer, CHF, dementia, DMII, HTN, pulmonary embolism, and thyroid disease. X-rays and CT scans without any concerning acute findings.    PT Comments    Patient tolerated treatment well but is not making much progress towards goals at this point. She slipped out of her chair yesterday while the alarm was on and was accompanied by sitter during today's treatment session. Patient stated she had pain in her right arm, left knee, and left foot and required persistent encouragement to participate in treatment session. Performed edge of bed sitting for several minutes while encouraging patient to participate in standing and seated self-care activities to improve seated and standing balance. Did stand up to sink to wash face. Required supervision while sitting and min A to maintain balance while standing. Ambulated ~480 feet with RW and min A to prevent falls and help position RW. Patient remembered to stand up tall and lift feet up off the floor at times.  She failed to properly position her RW when in tighter quarters and resisted its use while in the room, despite being very unsteady. HR and O2 sat remained WFL during session. Patient would benefit from continued physical therapy to address remaining impairments and functional limitations to work towards stated goals and return to PLOF or maximal functional independence.       Follow Up Recommendations  Home health PT;Supervision/Assistance - 24 hour     Equipment Recommendations  Rolling walker with 5" wheels    Recommendations for Other Services       Precautions / Restrictions Precautions Precautions: Fall;Other (comment) Precaution Comments: disoriented Restrictions Weight Bearing Restrictions: No    Mobility  Bed Mobility Overal bed mobility: Needs Assistance Bed Mobility: Supine to Sit     Supine to sit: Mod assist     General bed mobility comments: pt requires encouragement to participate, needs assistnace with trunk and LE  Transfers Overall transfer level: Needs assistance Equipment used: Rolling walker (2 wheeled) Transfers: Sit to/from Stand Sit to Stand: Min assist         General transfer comment: sit <> stand from chair x 2 trials to RW with min A physical asistance and one BSC to/from chair transfer without RW. Initialy very unstable and complains of feeling lightheaded. Improved stability with RW. Resisting use of RW during BSC to chair transfer stating she does not need it and holds on to walls and counters. Re-educated on importance of AD use for decreasing fall risk.   Ambulation/Gait Ambulation/Gait assistance: Min assist Gait Distance (Feet): 480 Feet Assistive device: Rolling walker (2 wheeled) Gait Pattern/deviations: Trunk flexed;Decreased stride length  General Gait Details: demo forward stoop, failed to keep RW close to body stepping outside of RW at times, mild improvement with cuing. Pt abandoned walker when in tight quarters and  resisted using it stating she did not need it and was told to hold onto walls. Re-educated pt  on importance of RW use to prevent falls.    Stairs             Wheelchair Mobility    Modified Rankin (Stroke Patients Only)       Balance Overall balance assessment: Needs assistance Sitting-balance support: No upper extremity supported Sitting balance-Leahy Scale: Good Sitting balance - Comments: required trunk support in sitting initially, but progressed to sitting with mod challenge without support    Standing balance support: Bilateral upper extremity supported Standing balance-Leahy Scale: Poor Standing balance comment: Required min A to maintain standing balance consistantly.                             Cognition Arousal/Alertness: Awake/alert Behavior During Therapy: (confused to situation, required encouragement to cooperate ) Overall Cognitive Status: History of cognitive impairments - at baseline                                 General Comments: history of alzheimers according to chart review lives in memory care.       Exercises Other Exercises Other Exercises: practiced proper transfer techniques to improve body and AD positioning and decrease fall risk.  Other Exercises: practiced standing balance at sink while washing face with BUE, washing hands, and during pericare. Pt unsteady without support and unaware of her deficits.  Other Exercises: edge of bed sitting for improved trunk control while combing hair and applying shampoo. Pt demo improved seated stability with practice.     General Comments        Pertinent Vitals/Pain Pain Assessment: Faces Faces Pain Scale: Hurts little more Pain Location: right shoulder when lifting, left knee, and left foot Pain Intervention(s): Monitored during session    Home Living                      Prior Function            PT Goals (current goals can now be found in the care plan  section) Acute Rehab PT Goals Patient Stated Goal: return home PT Goal Formulation: With patient Time For Goal Achievement: 09/20/18 Potential to Achieve Goals: Fair Progress towards PT goals: Not progressing toward goals - comment(not making much progress towards goals at this point. pt more resistant to treatment today, did remember some cuing to stand up straight and pick up feet while walking. )    Frequency    Min 2X/week      PT Plan Current plan remains appropriate    Co-evaluation              AM-PAC PT "6 Clicks" Mobility   Outcome Measure  Help needed turning from your back to your side while in a flat bed without using bedrails?: A Little Help needed moving from lying on your back to sitting on the side of a flat bed without using bedrails?: A Little Help needed moving to and from a bed to a chair (including a wheelchair)?: A Little Help needed standing up from a chair using your arms (e.g., wheelchair or bedside chair)?: A  Little Help needed to walk in hospital room?: A Little Help needed climbing 3-5 steps with a railing? : Total 6 Click Score: 16    End of Session Equipment Utilized During Treatment: Gait belt Activity Tolerance: Patient tolerated treatment well Patient left: in chair;with call bell/phone within reach;with chair alarm set;with nursing/sitter in room(with x-ray tech in room attending to pt) Nurse Communication: Mobility status(results of session) PT Visit Diagnosis: Unsteadiness on feet (R26.81);Muscle weakness (generalized) (M62.81);History of falling (Z91.81);Difficulty in walking, not elsewhere classified (R26.2)     Time: 0950-1030 PT Time Calculation (min) (ACUTE ONLY): 40 min  Charges:  $Gait Training: 8-22 mins $Therapeutic Activity: 23-37 mins                     Everlean Alstrom. Derren Suydam, PT, DPT 09/08/18, 10:50 AM  09/08/2018, 10:50 AM

## 2018-09-08 NOTE — Progress Notes (Addendum)
[place] at [place] NAME: Deborah Case    MR#:  [id number]  DATE OF BIRTH:  [birth year]  SUBJECTIVE:  CHIEF COMPLAINT:   Chief Complaint  Patient presents with  . Fall   The patient has no complaints.  She has dementia and confused.  She fell on the floor resulting injury yesterday afternoon and was put on sitter. REVIEW OF SYSTEMS:  Review of Systems  Unable to perform ROS: Dementia   The patient denies any symptoms. DRUG ALLERGIES:   Allergies  Allergen Reactions  . Aspirin Other (See Comments)    On plavix.  . Donepezil Other (See Comments)    Elevated bp  . Metformin Other (See Comments)  . Metformin And Related    VITALS:  Blood pressure 129/77, pulse 72, temperature 98.4 F (36.9 C), temperature source Oral, resp. rate 18, height 5\' 2"  (1.575 m), weight 63 kg, SpO2 100 %. PHYSICAL EXAMINATION:  Physical Exam Constitutional:      General: She is not in acute distress.    Appearance: Normal appearance.  HENT:     Head: Normocephalic.     Mouth/Throat:     Mouth: Mucous membranes are moist.  Eyes:     General: No scleral icterus.    Conjunctiva/sclera: Conjunctivae normal.     Pupils: Pupils are equal, round, and reactive to light.  Neck:     Musculoskeletal: Normal range of motion and neck supple.     Vascular: No JVD.     Trachea: No tracheal deviation.  Cardiovascular:     Rate and Rhythm: Normal rate and regular rhythm.     Heart sounds: Normal heart sounds. No murmur. No gallop.   Pulmonary:     Effort: Pulmonary effort is normal. No respiratory distress.     Breath sounds: Normal breath sounds. No wheezing or rales.  Abdominal:     General: Bowel sounds are normal. There is no distension.     Palpations: Abdomen is soft.     Tenderness: There is no abdominal tenderness. There is no rebound.  Musculoskeletal: Normal range of motion.        General: No tenderness.     Right lower leg: No edema.     Left  lower leg: No edema.  Skin:    Findings: No erythema or rash.  Neurological:     General: No focal deficit present.     Mental Status: She is alert.     Cranial Nerves: No cranial nerve deficit.     Comments: She is demented, follow commands.    LABORATORY PANEL:  Female CBC Recent Labs  Lab 09/06/18 0533  WBC 5.3  HGB 8.4*  HCT 26.6*  PLT 122*   ------------------------------------------------------------------------------------------------------------------ Chemistries  Recent Labs  Lab 09/05/18 1423  09/08/18 0429  NA 137   < > 141  K 5.7*   < > 4.8  CL 105   < > 109  CO2 25   < > 25  GLUCOSE 208*   < > 110*  BUN 50*   < > 43*  CREATININE 1.31*   < > 1.23*  CALCIUM 8.8*   < > 8.8*  AST 35  --   --   ALT 26  --   --   ALKPHOS 107  --   --   BILITOT 0.7  --   --    < > = values in this interval not displayed.   RADIOLOGY:  No results found. ASSESSMENT AND PLAN:   85 year old female patient with a known history of dementia, chronic congestive heart failure, type 2 diabetes mellitus, hyperlipidemia, hypertension is a resident of [facility] facility presented to emergency room for fall  -Acute hyperkalemia Could be secondary to renal insufficiency Improved with Kayexalate 15 g orally 1 dose and IV fluid.  -Acute kidney injury Hold diuretics She is rehydrated with IV fluid, follow-up BMP.  -Acute rhabdomyolysis Improving with IV fluid hydration. Secondary to fall  Hypertension.  Increased Coreg and continue lisinopril.  Blood pressure is controlled.  -Dementia Supportive care  Diabetes mellitus Diabetic diet with sliding scale coverage with insulin  Generalized weakness.  PT evaluation suggest home health and PT. All the records are reviewed and case discussed with Care Management/Social Worker. Management plans discussed with the patient, family and they are in agreement.  CODE STATUS: Full Code  TOTAL TIME TAKING CARE OF THIS PATIENT: 26  minutes.   More than 50% of the time was spent in counseling/coordination of care: YES  POSSIBLE D/C IN 1-2 DAYS, DEPENDING ON CLINICAL CONDITION.   Demetrios Loll M.D on 09/08/2018 at 3:54 PM  Between 7am to 6pm - Pager - [phone]  After 6pm go to [email or web address] - [place] [facility]

## 2018-09-09 LAB — BASIC METABOLIC PANEL
Anion gap: 7 (ref 5–15)
BUN: 38 mg/dL — ABNORMAL HIGH (ref 8–23)
CO2: 24 mmol/L (ref 22–32)
Calcium: 8.8 mg/dL — ABNORMAL LOW (ref 8.9–10.3)
Chloride: 109 mmol/L (ref 98–111)
Creatinine, Ser: 0.95 mg/dL (ref 0.44–1.00)
GFR calc Af Amer: >60 mL/min (ref >60)
GFR calc non Af Amer: 54 mL/min — ABNORMAL LOW (ref >60)
Glucose, Bld: 105 mg/dL — ABNORMAL HIGH (ref 70–99)
Potassium: 4.7 mmol/L (ref 3.5–5.1)
Sodium: 140 mmol/L (ref 135–145)

## 2018-09-09 LAB — GLUCOSE, CAPILLARY
Glucose-Capillary: 102 mg/dL — ABNORMAL HIGH (ref 70–99)
Glucose-Capillary: 153 mg/dL — ABNORMAL HIGH (ref 70–99)

## 2018-09-09 MED ORDER — QUETIAPINE FUMARATE 25 MG PO TABS: 25.0000 mg | ORAL_TABLET | Freq: Two times a day (BID) | ORAL | 0 refills | Status: DC

## 2018-09-09 MED ORDER — QUETIAPINE FUMARATE 25 MG PO TABS: 25.0000 mg | ORAL_TABLET | Freq: Two times a day (BID) | ORAL | 0 refills | Status: AC

## 2018-09-09 NOTE — Care Management Important Message (Signed)
Important Message  Patient Details  Name: Deborah Case MRN: [id number] Date of Birth: 02/23/1930   Medicare Important Message Given:  Yes    Juliann Pulse A Bradd Merlos 09/09/2018, 11:28 AM

## 2018-09-09 NOTE — Progress Notes (Signed)
Discharge Note Pt was discharged to [facility] with EMS providing transportation at 1501. Pt left with all her belongings. Pt reported being ready for discharge.

## 2018-09-09 NOTE — Discharge Summary (Signed)
[place] at [place] NAME: Deborah Case    MR#:  [id number]  DATE OF BIRTH:  26-Sep-1929  DATE OF ADMISSION:  09/05/2018 ADMITTING PHYSICIAN: Saundra Shelling, MD  DATE OF DISCHARGE: No discharge date for patient encounter.  PRIMARY CARE PHYSICIAN: Hortencia Pilar, MD    ADMISSION DIAGNOSIS:  Hyperkalemia [E87.5] Decreased hemoglobin [R71.0] Fall, initial encounter [W19.XXXA] Traumatic rhabdomyolysis, initial encounter ([place]) [T79.6XXA] Acute renal failure, unspecified acute renal failure type ([place]) [N17.9]  DISCHARGE DIAGNOSIS:  Active Problems:   AKI (acute kidney injury) ([place])   SECONDARY DIAGNOSIS:   Past Medical History:  Diagnosis Date  . Cancer ([place])    uterine  . CHF (congestive heart failure) ([place])   . Dementia ([place])   . Diabetes mellitus without complication ([place])   . Hypercholesteremia   . Hypertension   . Pulmonary embolism ([place])   . Thyroid disease     HOSPITAL COURSE:  85 year old female patientwith a known history ofdementia, chronic congestive heart failure, type 2 diabetes mellitus, hyperlipidemia, hypertension is a resident of [facility] facility presented to emergency room for fall  *Acute hyperkalemia Resolved Suspected due to renal insufficiency Treated with Kayexalate and IV fluids   *AKI Most likely secondary to poor p.o. intake and diuretic use  Resolved with IV fluids for rehydration, diuretics were held while in house   *Acute probable rhabdomyolysis Resolved with IV fluid hydration. Secondary to fall  *Chronic hypertension Controlled on current regiment  *Chronic dementia Stable  Provided supportive care, evaluated by physical therapy-recommended home health PT with 24-hour assistance with rolling walker with 5 inch wheels, for discharge back to assisted living facility  *Chronic diabetes mellitus Stable on current regiment    DISCHARGE CONDITIONS:   stable  CONSULTS  OBTAINED:    DRUG ALLERGIES:   Allergies  Allergen Reactions  . Aspirin Other (See Comments)    On plavix.  . Donepezil Other (See Comments)    Elevated bp  . Metformin Other (See Comments)  . Metformin And Related     DISCHARGE MEDICATIONS:   Allergies as of 09/09/2018      Reactions   Aspirin Other (See Comments)   On plavix.   Donepezil Other (See Comments)   Elevated bp   Metformin Other (See Comments)   Metformin And Related       Medication List    TAKE these medications   acetaminophen 500 MG tablet Commonly known as:  TYLENOL Take 500 mg by mouth every 4 (four) hours as needed for mild pain or moderate pain.   atorvastatin 80 MG tablet Commonly known as:  LIPITOR Take 80 mg by mouth at bedtime.   carvedilol 6.25 MG tablet Commonly known as:  COREG Take 6.25 mg by mouth daily.   clopidogrel 75 MG tablet Commonly known as:  PLAVIX Take 75 mg by mouth daily.   guaifenesin 100 MG/5ML syrup Commonly known as:  ROBITUSSIN Take 200 mg by mouth every 6 (six) hours as needed for cough.   levothyroxine 88 MCG tablet Commonly known as:  SYNTHROID, LEVOTHROID Take 88 mcg by mouth daily before breakfast.   lisinopril 10 MG tablet Commonly known as:  PRINIVIL,ZESTRIL Take 10 mg by mouth daily.   loperamide 2 MG capsule Commonly known as:  IMODIUM Take 2 mg by mouth as needed for diarrhea or loose stools. (max 8 doses in 24 hours)   magnesium hydroxide 400 MG/5ML suspension Commonly known as:  MILK OF MAGNESIA Take  30 mLs by mouth daily as needed for mild constipation.   Mintox 852-778-24 MG/5ML suspension Generic drug:  alum & mag hydroxide-simeth Take 30 mLs by mouth every 6 (six) hours as needed for indigestion or heartburn.   neomycin-bacitracin-polymyxin ointment Commonly known as:  NEOSPORIN Apply 1 application topically every 12 (twelve) hours.   QUEtiapine 25 MG tablet Commonly known as:  SEROQUEL Take 1-2 tablets (25-50 mg total) by mouth 2  (two) times daily. Take one tablet (25 mg) by mouth in the morning and two tablets (50 mg) at bedtime            Durable Medical Equipment  (From admission, onward)         Start     Ordered   09/09/18 1002  For home use only DME Walker rolling  [facility])  Once    Comments:  5 inch wheels  Question:  Patient needs a walker to treat with the following condition  Answer:  Ataxia   09/09/18 1002   09/06/18 1642  For home use only DME Walker rolling  Once    Question:  Patient needs a walker to treat with the following condition  Answer:  Weakness generalized   09/06/18 1641           DISCHARGE INSTRUCTIONS:      If you experience worsening of your admission symptoms, develop shortness of breath, life threatening emergency, suicidal or homicidal thoughts you must seek medical attention immediately by calling 911 or calling your MD immediately  if symptoms less severe.  You Must read complete instructions/literature along with all the possible adverse reactions/side effects for all the Medicines you take and that have been prescribed to you. Take any new Medicines after you have completely understood and accept all the possible adverse reactions/side effects.   Please note  You were cared for by a hospitalist during your hospital stay. If you have any questions about your discharge medications or the care you received while you were in the hospital after you are discharged, you can call the unit and asked to speak with the hospitalist on call if the hospitalist that took care of you is not available. Once you are discharged, your primary care physician will handle any further medical issues. Please note that NO REFILLS for any discharge medications will be authorized once you are discharged, as it is imperative that you return to your primary care physician (or establish a relationship with a primary care physician if you do not have one) for your aftercare needs so that they can  reassess your need for medications and monitor your lab values.    Today   CHIEF COMPLAINT:   Chief Complaint  Patient presents with  . Fall    HISTORY OF PRESENT ILLNESS:   85 y.o. female with a known history of dementia, chronic congestive heart failure, type 2 diabetes mellitus, hyperlipidemia, hypertension is a resident of [facility] facility.  She had 2 falls in the last couple of weeks.  Patient has generalized weakness.  Has dementia unable to give much history.  She was worked up in the emergency room her potassium was 5.5 and kidney functions have worsened and appears dehydrated.  Patient was worked up with CT head which showed no acute abnormality, CT chest no acute abnormality and CT cervical spine showed no fractures.   VITAL SIGNS:  Blood pressure (!) 130/55, pulse 74, temperature 98.4 F (36.9 C), temperature source Oral, resp. rate 18, height 5\' 2"  (  1.575 m), weight 63 kg, SpO2 98 %.  I/O:    Intake/Output Summary (Last 24 hours) at 09/09/2018 1002 Last data filed at 09/08/2018 2119 Gross per 24 hour  Intake 480 ml  Output 503 ml  Net -23 ml    PHYSICAL EXAMINATION:  GENERAL:  85 y.o.-year-old patient lying in the bed with no acute distress.  EYES: Pupils equal, round, reactive to light and accommodation. No scleral icterus. Extraocular muscles intact.  HEENT: Head atraumatic, normocephalic. Oropharynx and nasopharynx clear.  NECK:  Supple, no jugular venous distention. No thyroid enlargement, no tenderness.  LUNGS: Normal breath sounds bilaterally, no wheezing, rales,rhonchi or crepitation. No use of accessory muscles of respiration.  CARDIOVASCULAR: S1, S2 normal. No murmurs, rubs, or gallops.  ABDOMEN: Soft, non-tender, non-distended. Bowel sounds present. No organomegaly or mass.  EXTREMITIES: No pedal edema, cyanosis, or clubbing.  NEUROLOGIC: Cranial nerves II through XII are intact. Muscle strength 5/5 in all extremities. Sensation intact. Gait not  checked.  PSYCHIATRIC: The patient is alert and oriented x 3.  SKIN: No obvious rash, lesion, or ulcer.   DATA REVIEW:   CBC Recent Labs  Lab 09/06/18 0533  WBC 5.3  HGB 8.4*  HCT 26.6*  PLT 122*    Chemistries  Recent Labs  Lab 09/05/18 1423  09/09/18 0329  NA 137   < > 140  K 5.7*   < > 4.7  CL 105   < > 109  CO2 25   < > 24  GLUCOSE 208*   < > 105*  BUN 50*   < > 38*  CREATININE 1.31*   < > 0.95  CALCIUM 8.8*   < > 8.8*  AST 35  --   --   ALT 26  --   --   ALKPHOS 107  --   --   BILITOT 0.7  --   --    < > = values in this interval not displayed.    Cardiac Enzymes Recent Labs  Lab 09/05/18 1423  TROPONINI <0.03    Microbiology Results  Results for orders placed or performed during the hospital encounter of 09/05/18  MRSA PCR Screening     Status: None   Collection Time: 09/05/18 10:03 PM  Result Value Ref Range Status   MRSA by PCR NEGATIVE NEGATIVE Final    Comment:        The GeneXpert MRSA Assay (FDA approved for NASAL specimens only), is one component of a comprehensive MRSA colonization surveillance program. It is not intended to diagnose MRSA infection nor to guide or monitor treatment for MRSA infections. Performed at [facility], [address], [place], [place] [address]     RADIOLOGY:  Dg Shoulder Right  Result Date: 09/07/2018 CLINICAL DATA:  Pain with lifting. EXAM: RIGHT SHOULDER - 2+ VIEW COMPARISON:  None. FINDINGS: Degenerative changes in the [facility] joint with joint space narrowing and spurring. Glenohumeral joint is maintained. No acute bony abnormality. Specifically, no fracture, subluxation, or dislocation. Soft tissues are intact. IMPRESSION: Degenerative changes in the right AC joint. No acute bony abnormality. Electronically Signed   By: Rolm Baptise M.D.   On: 09/07/2018 10:32    EKG:   Orders placed or performed during the hospital encounter of 09/05/18  . ED EKG  . ED EKG  . EKG 12-Lead  . EKG 12-Lead       Management plans discussed with the patient, family and they are in agreement.  CODE STATUS:  Code Status Orders  (From admission, onward)         Start     Ordered   09/05/18 1849  Full code  Continuous     09/05/18 1848        Code Status History    This patient has a current code status but no historical code status.      TOTAL TIME TAKING CARE OF THIS PATIENT: 40 minutes.    Avel Peace Salary M.D on 09/09/2018 at 10:02 AM  Between 7am to 6pm - Pager - [phone]  After 6pm go to [email or web address] - password [email or web address] [facility]  Office  [phone]  CC: Primary care physician; Hortencia Pilar, MD   Note: This dictation was prepared with Dragon dictation along with smaller phrase technology. Any transcriptional errors that result from this process are unintentional.

## 2018-09-09 NOTE — TOC Transition Note (Signed)
Transition of Care [facility]) - CM/SW Discharge Note   Patient Details  Name: MARGARETHE VIRGEN MRN: [id number] Date of Birth: 04-03-1930  Transition of Care [facility]) CM/SW Contact:  Annamaria Boots, [place] Phone Number: 09/09/2018, 10:56 AM   Clinical Narrative:   Patient is medically ready for discharge today. Patient will return to [facility] care. CSW notified legal guardian Trey Paula of discharge today. CSW also spoke with [place] and notified of discharge today. Patient will be transported by EMS. RN to call for transport.     Final next level of care: Assisted Living Barriers to Discharge: No Barriers Identified   Patient Goals and CMS Choice   CMS Medicare.gov Compare Post Acute Care list provided to:: Legal Guardian Choice offered to / list presented to : [facility] POA / [place]  Discharge Placement              Patient chooses bed at: [facility] ) Patient to be transferred to facility by: EMS Name of family member notified: Legal guardian- Trey Paula  Patient and family notified of of transfer: 09/09/18  Discharge Plan and Services                DME Arranged: Gilford Rile rolling DME Agency: [facility] HH Arranged: RN, Social Work, PT, Nurse's Aide [place] Agency: [facility] [facility] [employer]   Social Determinants of Health ([place]) Interventions     Readmission Risk Interventions No flowsheet data found.

## 2018-09-11 ENCOUNTER — Inpatient Hospital Stay
Admission: EM | Admit: 2018-09-11 | Discharge: 2018-09-13 | DRG: 683 | Disposition: A | Discharge status: Skilled Nursing Facility | Payer: Medicare Other | Source: Skilled Nursing Facility | Attending: Internal Medicine | Admitting: Internal Medicine

## 2018-09-11 ENCOUNTER — Other Ambulatory Visit: Payer: Self-pay

## 2018-09-11 ENCOUNTER — Encounter: Payer: Self-pay | Admitting: Emergency Medicine

## 2018-09-11 ENCOUNTER — Emergency Department: Payer: Medicare Other

## 2018-09-11 DIAGNOSIS — Z888 Allergy status to other drugs, medicaments and biological substances status: Secondary | ICD-10-CM

## 2018-09-11 DIAGNOSIS — I110 Hypertensive heart disease with heart failure: Secondary | ICD-10-CM | POA: Diagnosis present

## 2018-09-11 DIAGNOSIS — N179 Acute kidney failure, unspecified: Secondary | ICD-10-CM | POA: Diagnosis present

## 2018-09-11 DIAGNOSIS — S81812A Laceration without foreign body, left lower leg, initial encounter: Secondary | ICD-10-CM | POA: Diagnosis present

## 2018-09-11 DIAGNOSIS — Y92129 Unspecified place in nursing home as the place of occurrence of the external cause: Secondary | ICD-10-CM

## 2018-09-11 DIAGNOSIS — E860 Dehydration: Secondary | ICD-10-CM | POA: Diagnosis present

## 2018-09-11 DIAGNOSIS — Z7902 Long term (current) use of antithrombotics/antiplatelets: Secondary | ICD-10-CM

## 2018-09-11 DIAGNOSIS — Z886 Allergy status to analgesic agent status: Secondary | ICD-10-CM

## 2018-09-11 DIAGNOSIS — Z86711 Personal history of pulmonary embolism: Secondary | ICD-10-CM | POA: Diagnosis not present

## 2018-09-11 DIAGNOSIS — D649 Anemia, unspecified: Secondary | ICD-10-CM | POA: Diagnosis present

## 2018-09-11 DIAGNOSIS — N390 Urinary tract infection, site not specified: Secondary | ICD-10-CM | POA: Diagnosis present

## 2018-09-11 DIAGNOSIS — E079 Disorder of thyroid, unspecified: Secondary | ICD-10-CM | POA: Diagnosis present

## 2018-09-11 DIAGNOSIS — I5032 Chronic diastolic (congestive) heart failure: Secondary | ICD-10-CM | POA: Diagnosis present

## 2018-09-11 DIAGNOSIS — F0280 Dementia in other diseases classified elsewhere without behavioral disturbance: Secondary | ICD-10-CM | POA: Diagnosis present

## 2018-09-11 DIAGNOSIS — E119 Type 2 diabetes mellitus without complications: Secondary | ICD-10-CM | POA: Diagnosis present

## 2018-09-11 DIAGNOSIS — E875 Hyperkalemia: Secondary | ICD-10-CM | POA: Diagnosis present

## 2018-09-11 DIAGNOSIS — G309 Alzheimer's disease, unspecified: Secondary | ICD-10-CM | POA: Diagnosis present

## 2018-09-11 DIAGNOSIS — R059 Cough, unspecified: Secondary | ICD-10-CM

## 2018-09-11 DIAGNOSIS — W19XXXA Unspecified fall, initial encounter: Secondary | ICD-10-CM

## 2018-09-11 DIAGNOSIS — W1830XA Fall on same level, unspecified, initial encounter: Secondary | ICD-10-CM | POA: Diagnosis present

## 2018-09-11 DIAGNOSIS — Z79890 Hormone replacement therapy (postmenopausal): Secondary | ICD-10-CM | POA: Diagnosis not present

## 2018-09-11 DIAGNOSIS — Z79899 Other long term (current) drug therapy: Secondary | ICD-10-CM

## 2018-09-11 DIAGNOSIS — E7800 Pure hypercholesterolemia, unspecified: Secondary | ICD-10-CM | POA: Diagnosis present

## 2018-09-11 DIAGNOSIS — R05 Cough: Secondary | ICD-10-CM

## 2018-09-11 DIAGNOSIS — B9620 Unspecified Escherichia coli [E. coli] as the cause of diseases classified elsewhere: Secondary | ICD-10-CM | POA: Diagnosis present

## 2018-09-11 LAB — CBC
HCT: 29.1 % — ABNORMAL LOW (ref 36.0–46.0)
Hemoglobin: 9.1 g/dL — ABNORMAL LOW (ref 12.0–15.0)
MCH: 29.4 pg (ref 26.0–34.0)
MCHC: 31.3 g/dL (ref 30.0–36.0)
MCV: 93.9 fL (ref 80.0–100.0)
Platelets: 122 10*3/uL — ABNORMAL LOW (ref 150–400)
RBC: 3.1 MIL/uL — ABNORMAL LOW (ref 3.87–5.11)
RDW: 15.9 % — ABNORMAL HIGH (ref 11.5–15.5)
WBC: 5.1 10*3/uL (ref 4.0–10.5)
nRBC: 0 % (ref 0.0–0.2)

## 2018-09-11 LAB — BASIC METABOLIC PANEL
Anion gap: 9 (ref 5–15)
BUN: 58 mg/dL — ABNORMAL HIGH (ref 8–23)
CO2: 25 mmol/L (ref 22–32)
Calcium: 9.3 mg/dL (ref 8.9–10.3)
Chloride: 103 mmol/L (ref 98–111)
Creatinine, Ser: 1.58 mg/dL — ABNORMAL HIGH (ref 0.44–1.00)
GFR calc Af Amer: 34 mL/min — ABNORMAL LOW (ref >60)
GFR calc non Af Amer: 29 mL/min — ABNORMAL LOW (ref >60)
Glucose, Bld: 161 mg/dL — ABNORMAL HIGH (ref 70–99)
Potassium: 5.5 mmol/L — ABNORMAL HIGH (ref 3.5–5.1)
Sodium: 137 mmol/L (ref 135–145)

## 2018-09-11 LAB — URINALYSIS, COMPLETE (UACMP) WITH MICROSCOPIC
Bilirubin Urine: NEGATIVE
Glucose, UA: NEGATIVE mg/dL
Hgb urine dipstick: NEGATIVE
Ketones, ur: NEGATIVE mg/dL
Nitrite: POSITIVE — AB
Protein, ur: NEGATIVE mg/dL
Specific Gravity, Urine: 1.012 (ref 1.005–1.030)
WBC, UA: >50 WBC/hpf — ABNORMAL HIGH (ref 0–5)
pH: 6 (ref 5.0–8.0)

## 2018-09-11 LAB — CK: Total CK: 93 U/L (ref 38–234)

## 2018-09-11 LAB — GLUCOSE, CAPILLARY: Glucose-Capillary: 133 mg/dL — ABNORMAL HIGH (ref 70–99)

## 2018-09-11 MED ORDER — ALBUTEROL SULFATE (2.5 MG/3ML) 0.083% IN NEBU: 2.5000 mg | INHALATION_SOLUTION | RESPIRATORY_TRACT | Status: DC | PRN

## 2018-09-11 MED ORDER — GUAIFENESIN 100 MG/5ML PO SYRP
200.0000 mg | ORAL_SOLUTION | Freq: Four times a day (QID) | ORAL | Status: DC | PRN
  Filled 2018-09-11: qty 10

## 2018-09-11 MED ORDER — SODIUM CHLORIDE 0.9 % IV SOLN: 1.0000 g | INTRAVENOUS | Status: DC

## 2018-09-11 MED ORDER — LEVOTHYROXINE SODIUM 88 MCG PO TABS
88.0000 ug | ORAL_TABLET | Freq: Every day | ORAL | Status: DC
  Administered 2018-09-12 – 2018-09-13 (×2): 88 ug via ORAL
  Filled 2018-09-11 (×2): qty 1

## 2018-09-11 MED ORDER — SODIUM CHLORIDE 0.9 % IV SOLN
1.0000 g | INTRAVENOUS | Status: DC
  Administered 2018-09-11 – 2018-09-12 (×2): 1 g via INTRAVENOUS
  Filled 2018-09-11 (×2): qty 1

## 2018-09-11 MED ORDER — LORAZEPAM 0.5 MG PO TABS: 0.5000 mg | ORAL_TABLET | Freq: Four times a day (QID) | ORAL | Status: DC | PRN

## 2018-09-11 MED ORDER — ACETAMINOPHEN 650 MG RE SUPP: 650.0000 mg | Freq: Four times a day (QID) | RECTAL | Status: DC | PRN

## 2018-09-11 MED ORDER — QUETIAPINE FUMARATE 25 MG PO TABS
25.0000 mg | ORAL_TABLET | ORAL | Status: AC
  Administered 2018-09-11: 18:00:00 25 mg via ORAL
  Filled 2018-09-11: qty 1

## 2018-09-11 MED ORDER — SODIUM CHLORIDE 0.9 % IV SOLN
INTRAVENOUS | Status: DC
  Administered 2018-09-11 – 2018-09-12 (×2): via INTRAVENOUS

## 2018-09-11 MED ORDER — ONDANSETRON HCL 4 MG PO TABS: 4.0000 mg | ORAL_TABLET | Freq: Four times a day (QID) | ORAL | Status: DC | PRN

## 2018-09-11 MED ORDER — ENOXAPARIN SODIUM 40 MG/0.4ML ~~LOC~~ SOLN
30.0000 mg | SUBCUTANEOUS | Status: DC
  Administered 2018-09-11: 20:00:00 30 mg via SUBCUTANEOUS
  Filled 2018-09-11: qty 0.4

## 2018-09-11 MED ORDER — SODIUM POLYSTYRENE SULFONATE 15 GM/60ML PO SUSP
15.0000 g | Freq: Once | ORAL | Status: AC
  Administered 2018-09-11: 18:00:00 15 g via ORAL
  Filled 2018-09-11: qty 60

## 2018-09-11 MED ORDER — INFLUENZA VAC SPLIT HIGH-DOSE 0.5 ML IM SUSY
0.5000 mL | PREFILLED_SYRINGE | INTRAMUSCULAR | Status: DC
  Filled 2018-09-11: qty 0.5

## 2018-09-11 MED ORDER — CLOPIDOGREL BISULFATE 75 MG PO TABS
75.0000 mg | ORAL_TABLET | Freq: Every day | ORAL | Status: DC
  Administered 2018-09-11 – 2018-09-13 (×3): 75 mg via ORAL
  Filled 2018-09-11 (×3): qty 1

## 2018-09-11 MED ORDER — QUETIAPINE FUMARATE 25 MG PO TABS
50.0000 mg | ORAL_TABLET | Freq: Every day | ORAL | Status: DC
  Administered 2018-09-11 – 2018-09-12 (×2): 50 mg via ORAL
  Filled 2018-09-11 (×2): qty 2

## 2018-09-11 MED ORDER — SODIUM CHLORIDE 0.9 % IV BOLUS
500.0000 mL | Freq: Once | INTRAVENOUS | Status: AC
  Administered 2018-09-11: 500 mL via INTRAVENOUS

## 2018-09-11 MED ORDER — POLYETHYLENE GLYCOL 3350 17 G PO PACK: 17.0000 g | PACK | Freq: Every day | ORAL | Status: DC | PRN

## 2018-09-11 MED ORDER — QUETIAPINE FUMARATE 25 MG PO TABS: 25.0000 mg | ORAL_TABLET | Freq: Two times a day (BID) | ORAL | Status: DC

## 2018-09-11 MED ORDER — CARVEDILOL 6.25 MG PO TABS
6.2500 mg | ORAL_TABLET | Freq: Every day | ORAL | Status: DC
  Administered 2018-09-11 – 2018-09-13 (×2): 6.25 mg via ORAL
  Filled 2018-09-11 (×2): qty 1

## 2018-09-11 MED ORDER — ACETAMINOPHEN 325 MG PO TABS: 650.0000 mg | ORAL_TABLET | Freq: Four times a day (QID) | ORAL | Status: DC | PRN

## 2018-09-11 MED ORDER — AMLODIPINE BESYLATE 5 MG PO TABS
2.5000 mg | ORAL_TABLET | Freq: Every day | ORAL | Status: DC
  Administered 2018-09-13: 12:00:00 2.5 mg via ORAL
  Filled 2018-09-11: qty 1

## 2018-09-11 MED ORDER — ONDANSETRON HCL 4 MG/2ML IJ SOLN: 4.0000 mg | Freq: Four times a day (QID) | INTRAMUSCULAR | Status: DC | PRN

## 2018-09-11 MED ORDER — MAGNESIUM HYDROXIDE 400 MG/5ML PO SUSP: 30.0000 mL | Freq: Every day | ORAL | Status: DC | PRN

## 2018-09-11 MED ORDER — QUETIAPINE FUMARATE 25 MG PO TABS
25.0000 mg | ORAL_TABLET | Freq: Every morning | ORAL | Status: DC
  Administered 2018-09-12 – 2018-09-13 (×2): 25 mg via ORAL
  Filled 2018-09-11 (×2): qty 1

## 2018-09-11 MED ORDER — INSULIN ASPART 100 UNIT/ML ~~LOC~~ SOLN
0.0000 [IU] | Freq: Three times a day (TID) | SUBCUTANEOUS | Status: DC
  Administered 2018-09-12: 12:00:00 2 [IU] via SUBCUTANEOUS
  Administered 2018-09-13: 12:00:00 1 [IU] via SUBCUTANEOUS
  Filled 2018-09-11 (×2): qty 1

## 2018-09-11 MED ORDER — ATORVASTATIN CALCIUM 20 MG PO TABS
80.0000 mg | ORAL_TABLET | Freq: Every day | ORAL | Status: DC
  Administered 2018-09-11 – 2018-09-12 (×2): 80 mg via ORAL
  Filled 2018-09-11 (×2): qty 4

## 2018-09-11 NOTE — ED Provider Notes (Signed)
[facility] Emergency Department Provider Note ____________________________________________   First MD Initiated Contact with Patient 09/11/18 1528     (approximate)  I have reviewed the triage vital signs and the nursing notes.  HISTORY  Chief Complaint Fall  EM caveat: Patient with dementia unable to recall poor historian  HPI LORRAIN RIVERS is a 85 y.o. female here for evaluation after a fall  Patient cannot really remember what happened.  EMS reports patient had a fall at her care home.  It was unwitnessed  Patient reports only place she is hurting right now is her left knee and a little sore around her left shoulder pit and pointing mid humerus.  She does remember what happened but thinks she must of fallen.  Does not know where she is at the present time, thinks she might be at home.  She does deny having a headache neck pain chest pain or trouble breathing.  She is not deemed to be a reliable historian however    Past Medical History:  Diagnosis Date   Cancer [facility])    uterine   CHF (congestive heart failure) (HCC)    Dementia ([place])    Diabetes mellitus without complication ([place])    Hypercholesteremia    Hypertension    Pulmonary embolism ([place])    Thyroid disease     Patient Active Problem List   Diagnosis Date Noted   AKI (acute kidney injury) ([place]) 09/05/2018    Past Surgical History:  Procedure Laterality Date   ABDOMINAL HYSTERECTOMY      Prior to Admission medications   Medication Sig Start Date End Date Taking? Authorizing Provider  acetaminophen (TYLENOL) 500 MG tablet Take 500 mg by mouth every 4 (four) hours as needed for mild pain or moderate pain.    [provider]  alum & mag hydroxide-simeth ([place]) 200-200-20 MG/5ML suspension Take 30 mLs by mouth every 6 (six) hours as needed for indigestion or heartburn.    [provider]  atorvastatin (LIPITOR) 80 MG tablet Take 80 mg by mouth at  bedtime.    [provider]  carvedilol (COREG) 6.25 MG tablet Take 6.25 mg by mouth daily.     [provider]  clopidogrel (PLAVIX) 75 MG tablet Take 75 mg by mouth daily.    [provider]  guaifenesin (ROBITUSSIN) 100 MG/5ML syrup Take 200 mg by mouth every 6 (six) hours as needed for cough.    [provider]  levothyroxine (SYNTHROID, LEVOTHROID) 88 MCG tablet Take 88 mcg by mouth daily before breakfast.    [provider]  lisinopril (PRINIVIL,ZESTRIL) 10 MG tablet Take 10 mg by mouth daily. 12/22/17 12/23/18  [provider]  loperamide (IMODIUM) 2 MG capsule Take 2 mg by mouth as needed for diarrhea or loose stools. (max 8 doses in 24 hours)    [provider]  magnesium hydroxide (MILK OF MAGNESIA) 400 MG/5ML suspension Take 30 mLs by mouth daily as needed for mild constipation.    [provider]  neomycin-bacitracin-polymyxin (NEOSPORIN) ointment Apply 1 application topically every 12 (twelve) hours.    [provider]  QUEtiapine (SEROQUEL) 25 MG tablet Take 1-2 tablets (25-50 mg total) by mouth 2 (two) times daily. Take one tablet (25 mg) by mouth in the morning and two tablets (50 mg) at bedtime 09/09/18   Salary, Holly Bodily D, MD    Allergies Aspirin; Donepezil; Metformin; and Metformin and related  History reviewed. No pertinent family history.  Social  History Social History   Tobacco Use   Smoking status: Never Smoker   Smokeless tobacco: Never Used  Substance Use Topics   Alcohol use: No   Drug use: No    Review of Systems EM caveat   ____________________________________________   PHYSICAL EXAM:  VITAL SIGNS: ED Triage Vitals  Enc Vitals Group     BP 09/11/18 1500 (!) 157/47     Pulse Rate 09/11/18 1500 (!) 58     Resp 09/11/18 1500 18     Temp 09/11/18 1500 98 F (36.7 C)     Temp Source 09/11/18 1500 Oral     SpO2 09/11/18 1500 100 %     Weight 09/11/18 1501 138 lb 14.2  oz (63 kg)     Height 09/11/18 1501 5\' 2"  (1.575 m)     Head Circumference --      Peak Flow --      Pain Score 09/11/18 1501 3     Pain Loc --      Pain Edu? --      Excl. in [place]? --     Constitutional: Alert and oriented.  She is only oriented to self however not to situation or place.  She does have a noted history of significant dementia Eyes: Conjunctivae are normal. Head: Atraumatic except for what appears to be an old bruise just below the left orbit. Nose: No congestion/rhinnorhea. Mouth/Throat: Mucous membranes are slightly dry. Neck: No stridor.  Cardiovascular: Normal rate, regular rhythm. Grossly normal heart sounds.  Good peripheral circulation. Respiratory: Normal respiratory effort.  No retractions. Lungs CTAB. Gastrointestinal: Soft and nontender. No distention. Musculoskeletal:   RIGHT Right upper extremity demonstrates normal strength, good use of all muscles except some pain the patient identifies mid right humerus without deformity or obvious bruising. No edema bruising or contusions of the right shoulder/upper arm, right elbow, right forearm / hand. Full range of motion of the right right upper extremity without pain except some around the mid humerus. No evidence of trauma. Strong radial pulse. Intact median/ulnar/radial neuro-muscular exam.  LEFT Left upper extremity demonstrates normal strength, good use of all muscles. No edema bruising or contusions of the left shoulder/upper arm, left elbow, left forearm / hand. Full range of motion of the left  upper extremity without pain. No evidence of trauma. Strong radial pulse. Intact median/ulnar/radial neuro-muscular exam.  Lower Extremities  No edema. Normal DP/PT pulses bilateral with good cap refill.  Normal neuro-motor function lower extremities bilateral.  RIGHT Right lower extremity demonstrates normal strength, good use of all muscles. No edema bruising or contusions of the right hip, right knee, right  ankle. Full range of motion of the right lower extremity without pain. No pain on axial loading. No evidence of trauma.  LEFT Left lower extremity demonstrates normal strength, good use of all muscles except some limitation of the left knee which she reports hurts when she bends it. No edema bruising or contusions of the hip, ankle. Full range of motion of the left lower extremity but does report induces pain over her kneecap area.  There is a skin tear about the size of the palm overlying her kneecap, however does not have any deepness to it or laceration component.  Bleeding is controlled    Neurologic:  Normal speech and language. No gross focal neurologic deficits are appreciated.  Skin:  Skin is warm, dry and intact. No rash noted. Psychiatric: Mood and affect are normal. Speech and behavior are normal.  ____________________________________________  LABS (all labs ordered are listed, but only abnormal results are displayed)  Labs Reviewed  CBC - Abnormal; Notable for the following components:      Result Value   RBC 3.10 (*)    Hemoglobin 9.1 (*)    HCT 29.1 (*)    RDW 15.9 (*)    Platelets 122 (*)    All other components within normal limits  BASIC METABOLIC PANEL - Abnormal; Notable for the following components:   Potassium 5.5 (*)    Glucose, Bld 161 (*)    BUN 58 (*)    Creatinine, Ser 1.58 (*)    GFR calc non Af Amer 29 (*)    GFR calc Af Amer 34 (*)    All other components within normal limits  URINALYSIS, COMPLETE (UACMP) WITH MICROSCOPIC - Abnormal; Notable for the following components:   Color, Urine YELLOW (*)    APPearance CLOUDY (*)    Nitrite POSITIVE (*)    Leukocytes,Ua LARGE (*)    WBC, UA >50 (*)    Bacteria, UA RARE (*)    All other components within normal limits  CK  BASIC METABOLIC PANEL  CBC   ____________________________________________  EKG  Reviewed entered by me at 1602 Heart rate 69 PR 160 QRS 120 QTc 400 Normal sinus rhythm, no  evidence of acute ischemia ____________________________________________  RADIOLOGY  Ct Head Wo Contrast  Result Date: 09/11/2018 CLINICAL DATA:  Patient tripped and fell. Multiple bruises. History of dementia. EXAM: CT HEAD WITHOUT CONTRAST CT CERVICAL SPINE WITHOUT CONTRAST TECHNIQUE: Multidetector CT imaging of the head and cervical spine was performed following the standard protocol without intravenous contrast. Multiplanar CT image reconstructions of the cervical spine were also generated. COMPARISON:  Prior CTs 09/05/2018. FINDINGS: CT HEAD FINDINGS Brain: There is no evidence of acute intracranial hemorrhage, mass lesion, brain edema or extra-axial fluid collection. The ventricles and subarachnoid spaces are appropriately sized for age. There are stable mild chronic small vessel ischemic changes in the periventricular white matter. Vascular: Diffuse intracranial vascular calcifications. No hyperdense vessel identified. Skull: Negative for fracture or focal lesion. Sinuses/Orbits: The visualized paranasal sinuses and mastoid air cells are clear. No orbital abnormalities are seen. Other: None. CT CERVICAL SPINE FINDINGS Alignment: Stable near anatomic alignment. No focal angulation or listhesis. Skull base and vertebrae: No evidence of acute fracture or traumatic subluxation. Soft tissues and spinal canal: No prevertebral fluid or swelling. No visible canal hematoma. Disc levels: No large disc herniation. Stable spondylosis, greatest at C6-7. Calcified pannus surrounding the odontoid process again noted. Upper chest: Mild scarring at the right lung apex. Other: Bilateral carotid atherosclerosis. IMPRESSION: 1. No acute intracranial or calvarial findings. 2. No evidence of acute cervical spine fracture, traumatic subluxation or static signs of instability. Electronically Signed   By: Richardean Sale M.D.   On: 09/11/2018 17:23   Ct Cervical Spine Wo Contrast  Result Date: 09/11/2018 CLINICAL DATA:   Patient tripped and fell. Multiple bruises. History of dementia. EXAM: CT HEAD WITHOUT CONTRAST CT CERVICAL SPINE WITHOUT CONTRAST TECHNIQUE: Multidetector CT imaging of the head and cervical spine was performed following the standard protocol without intravenous contrast. Multiplanar CT image reconstructions of the cervical spine were also generated. COMPARISON:  Prior CTs 09/05/2018. FINDINGS: CT HEAD FINDINGS Brain: There is no evidence of acute intracranial hemorrhage, mass lesion, brain edema or extra-axial fluid collection. The ventricles and subarachnoid spaces are appropriately sized for age. There are stable mild chronic small vessel ischemic changes in the periventricular  white matter. Vascular: Diffuse intracranial vascular calcifications. No hyperdense vessel identified. Skull: Negative for fracture or focal lesion. Sinuses/Orbits: The visualized paranasal sinuses and mastoid air cells are clear. No orbital abnormalities are seen. Other: None. CT CERVICAL SPINE FINDINGS Alignment: Stable near anatomic alignment. No focal angulation or listhesis. Skull base and vertebrae: No evidence of acute fracture or traumatic subluxation. Soft tissues and spinal canal: No prevertebral fluid or swelling. No visible canal hematoma. Disc levels: No large disc herniation. Stable spondylosis, greatest at C6-7. Calcified pannus surrounding the odontoid process again noted. Upper chest: Mild scarring at the right lung apex. Other: Bilateral carotid atherosclerosis. IMPRESSION: 1. No acute intracranial or calvarial findings. 2. No evidence of acute cervical spine fracture, traumatic subluxation or static signs of instability. Electronically Signed   By: Richardean Sale M.D.   On: 09/11/2018 17:23   Dg Knee Complete 4 Views Left  Result Date: 09/11/2018 CLINICAL DATA:  Left knee pain after fall. EXAM: LEFT KNEE - COMPLETE 4+ VIEW COMPARISON:  Radiographs of August 30, 2018. FINDINGS: No evidence of fracture, dislocation,  or joint effusion. Joint spaces are intact. Vascular calcifications are noted. Chondrocalcinosis is seen involving the medial and lateral joint spaces. IMPRESSION: Chondrocalcinosis is noted suggesting early degenerative joint disease or possibly calcium pyrophosphate deposition disease. No acute abnormality seen in the left knee. Electronically Signed   By: Marijo Conception, M.D.   On: 09/11/2018 15:45   Dg Humerus Right  Result Date: 09/11/2018 CLINICAL DATA:  Fall today with upper right arm pain EXAM: RIGHT HUMERUS - 2+ VIEW COMPARISON:  09/07/2018 shoulder radiograph FINDINGS: There is no evidence of fracture or other focal bone lesions. Acromioclavicular osteoarthritis. Osteopenia. IMPRESSION: No acute finding Electronically Signed   By: Monte Fantasia M.D.   On: 09/11/2018 17:33      Imaging results reviewed, negative for acute injuries on imaging ____________________________________________   PROCEDURES  Procedure(s) performed: None  Procedures  Critical Care performed: No  ____________________________________________   INITIAL IMPRESSION / ASSESSMENT AND PLAN / ED COURSE  Pertinent labs & imaging results that were available during my care of the patient were reviewed by me and considered in my medical decision making (see chart for details).   Patient represents after falling.  She had a fall and was just discharged from the hospital 2 days ago.  She is falling once again.  She does have evidence of skin tear over the left knee which we have imaged that does not show acute fracture.  Also right pain in the humerus.  We will obtain imaging this as well.  It was unwitnessed and do the patient's dementia CT head and neck will be ordered though clinical examination suggest is unlikely there will be intracranial hemorrhage or cervical fracture.  Additionally lab work checked, she again has acute kidney injury, her CK is normal today however we will initiate IV fluids and given her  recurrent falls and recurrent acute kidney injury will readmit to the hospital for further care and management    ----------------------------------------- 4:55 PM on 09/11/2018 -----------------------------------------  Patient plan to be readmitted due to recurrent falls, recurrent acute kidney injury.  Discussed with Dr. Darvin Neighbours pending urinalysis at this time  ____________________________________________   FINAL CLINICAL IMPRESSION(S) / ED DIAGNOSES  Final diagnoses:  AKI (acute kidney injury) ([place])  Dehydration  Fall, initial encounter  Skin tear of left lower leg without complication, initial encounter        Note:  This document was prepared using  Dragon Armed forces training and education officer and may include unintentional dictation errors       Delman Kitten, MD 09/11/18 1739

## 2018-09-11 NOTE — ED Notes (Signed)
Pt leaving for imaging.

## 2018-09-11 NOTE — ED Notes (Signed)
Pt attempting to get up to use restroom. Will assist. [place]us temporarily paused.

## 2018-09-11 NOTE — ED Notes (Signed)
Pt back from imaging

## 2018-09-11 NOTE — ED Notes (Signed)
Pt given food tray and drink. Pt eating comfortably in bed. Bed in lowest position, rails up, pt denies any other needs.

## 2018-09-11 NOTE — ED Notes (Signed)
Floor RN to call this RN back once available.

## 2018-09-11 NOTE — ED Triage Notes (Signed)
Pt tripped and fell hitting her knee on a step (per pt). Pt is from [facility] home with hx dementia. Pt also c/o rt arm pain. Has multiple bruises in multiple stages of healing. Let knee skin tear, wrapped by ems.

## 2018-09-11 NOTE — H&P (Signed)
[facility] NAME: Deborah Case    MR#:  [id number]  DATE OF BIRTH:  1929/10/27  DATE OF ADMISSION:  09/11/2018  PRIMARY CARE PHYSICIAN: Hortencia Pilar, MD   REQUESTING/REFERRING PHYSICIAN: Dr. Jacqualine Code  CHIEF COMPLAINT:   Chief Complaint  Patient presents with  . Fall    HISTORY OF PRESENT ILLNESS:  Deborah Case  is a 85 y.o. female with a known history of hypertension, diabetes mellitus, dementia, CHF, pulmonary embolism presents to the emergency room after having a fall at her assisted living facility.  Here patient complained of right arm pain from fall and an x-ray was checked which showed no fractures or dislocation.  She is found to have acute kidney injury with hyperkalemia.  Patient was recently admitted for rhabdomyolysis, acute kidney injury and hyperkalemia.  She is on lisinopril for her blood pressure. Also found to have UTI today. Poor historian with dementia.  Confused.  PAST MEDICAL HISTORY:   Past Medical History:  Diagnosis Date  . Cancer ([place])    uterine  . CHF (congestive heart failure) ([place])   . Dementia ([place])   . Diabetes mellitus without complication ([place])   . Hypercholesteremia   . Hypertension   . Pulmonary embolism ([place])   . Thyroid disease     PAST SURGICAL HISTORY:   Past Surgical History:  Procedure Laterality Date  . ABDOMINAL HYSTERECTOMY      SOCIAL HISTORY:   Social History   Tobacco Use  . Smoking status: Never Smoker  . Smokeless tobacco: Never Used  Substance Use Topics  . Alcohol use: No    FAMILY HISTORY:  Unable obtain due to dementia  DRUG ALLERGIES:   Allergies  Allergen Reactions  . Aspirin Other (See Comments)    On plavix.  . Donepezil Other (See Comments)    Elevated bp  . Metformin Other (See Comments)  . Metformin And Related     REVIEW OF SYSTEMS:   Review of Systems  Unable to perform ROS: Dementia    MEDICATIONS AT HOME:   Prior to Admission  medications   Medication Sig Start Date End Date Taking? Authorizing Provider  acetaminophen (TYLENOL) 500 MG tablet Take 500 mg by mouth every 4 (four) hours as needed for mild pain or moderate pain.    [provider]  alum & mag hydroxide-simeth ([place]) 200-200-20 MG/5ML suspension Take 30 mLs by mouth every 6 (six) hours as needed for indigestion or heartburn.    [provider]  atorvastatin (LIPITOR) 80 MG tablet Take 80 mg by mouth at bedtime.    [provider]  carvedilol (COREG) 6.25 MG tablet Take 6.25 mg by mouth daily.     [provider]  clopidogrel (PLAVIX) 75 MG tablet Take 75 mg by mouth daily.    [provider]  guaifenesin (ROBITUSSIN) 100 MG/5ML syrup Take 200 mg by mouth every 6 (six) hours as needed for cough.    [provider]  levothyroxine (SYNTHROID, LEVOTHROID) 88 MCG tablet Take 88 mcg by mouth daily before breakfast.    [provider]  lisinopril (PRINIVIL,ZESTRIL) 10 MG tablet Take 10 mg by mouth daily. 12/22/17 12/23/18  [provider]  loperamide (IMODIUM) 2 MG capsule Take 2 mg by mouth as needed for diarrhea or loose stools. (max 8 doses in 24 hours)    [provider]  magnesium hydroxide (MILK OF MAGNESIA) 400 MG/5ML suspension Take 30 mLs by mouth daily  as needed for mild constipation.    [provider]  neomycin-bacitracin-polymyxin (NEOSPORIN) ointment Apply 1 application topically every 12 (twelve) hours.    [provider]  QUEtiapine (SEROQUEL) 25 MG tablet Take 1-2 tablets (25-50 mg total) by mouth 2 (two) times daily. Take one tablet (25 mg) by mouth in the morning and two tablets (50 mg) at bedtime 09/09/18   Salary, [place] D, MD     VITAL SIGNS:  Blood pressure 133/61, pulse (!) 52, temperature 98 F (36.7 C), temperature source Oral, resp. rate 18, height 5\' 2"  (1.575 m), weight 63 kg, SpO2 100 %.  PHYSICAL EXAMINATION:  Physical Exam  GENERAL:   85 y.o.-year-old patient lying in the bed with no acute distress.  EYES: Pupils equal, round, reactive to light and accommodation. No scleral icterus. Extraocular muscles intact.  HEENT: Head atraumatic, normocephalic. Oropharynx and nasopharynx clear. No oropharyngeal erythema, moist oral mucosa  NECK:  Supple, no jugular venous distention. No thyroid enlargement, no tenderness.  LUNGS: Normal breath sounds bilaterally, no wheezing, rales, rhonchi. No use of accessory muscles of respiration.  CARDIOVASCULAR: S1, S2 normal. No murmurs, rubs, or gallops.  ABDOMEN: Soft, nontender, nondistended. Bowel sounds present. No organomegaly or mass.  EXTREMITIES: No pedal edema, cyanosis, or clubbing. + 2 pedal & radial pulses b/l.   NEUROLOGIC: Cranial nerves II through XII are intact. No focal Motor or sensory deficits appreciated b/l PSYCHIATRIC: The patient is alert and awake. confused SKIN: No obvious rash, lesion, or ulcer.   LABORATORY PANEL:   CBC Recent Labs  Lab 09/11/18 1550  WBC 5.1  HGB 9.1*  HCT 29.1*  PLT 122*   ------------------------------------------------------------------------------------------------------------------  Chemistries  Recent Labs  Lab 09/05/18 1423  09/11/18 1550  NA 137   < > 137  K 5.7*   < > 5.5*  CL 105   < > 103  CO2 25   < > 25  GLUCOSE 208*   < > 161*  BUN 50*   < > 58*  CREATININE 1.31*   < > 1.58*  CALCIUM 8.8*   < > 9.3  AST 35  --   --   ALT 26  --   --   ALKPHOS 107  --   --   BILITOT 0.7  --   --    < > = values in this interval not displayed.   ------------------------------------------------------------------------------------------------------------------  Cardiac Enzymes Recent Labs  Lab 09/05/18 1423  TROPONINI <0.03   ------------------------------------------------------------------------------------------------------------------  RADIOLOGY:  Dg Knee Complete 4 Views Left  Result Date: 09/11/2018 CLINICAL DATA:   Left knee pain after fall. EXAM: LEFT KNEE - COMPLETE 4+ VIEW COMPARISON:  Radiographs of August 30, 2018. FINDINGS: No evidence of fracture, dislocation, or joint effusion. Joint spaces are intact. Vascular calcifications are noted. Chondrocalcinosis is seen involving the medial and lateral joint spaces. IMPRESSION: Chondrocalcinosis is noted suggesting early degenerative joint disease or possibly calcium pyrophosphate deposition disease. No acute abnormality seen in the left knee. Electronically Signed   By: Marijo Conception, M.D.   On: 09/11/2018 15:45     IMPRESSION AND PLAN:   *Acute kidney injury with hyperkalemia due to decreased oral intake.  Patient is also on lisinopril.  This will be held.  Start IV fluids. Repeat BMP in the.  Telemetry monitoring.  *Hypertension.  Discontinue lisinopril.  I added Norvasc in its place.  Will need prescription for this at discharge.  *Chronic diastolic CHF.  No signs of fluid overload.  *  Dementia.  Monitor for inpatient delirium.  *Diabetes mellitus.  Sliding scale insulin added.  DVT prophylaxis with Lovenox  All the records are reviewed and case discussed with ED provider. Management plans discussed with the patient, family and they are in agreement.  CODE STATUS: FULL CODE  TOTAL TIME TAKING CARE OF THIS PATIENT: 45 minutes.   Neita Carp M.D on 09/11/2018 at 5:05 PM  Between 7am to 6pm - Pager - [phone]  After 6pm go to [email or web address] - password [email or web address] [facility]  Office  [phone]  CC: Primary care physician; Hortencia Pilar, MD  Note: This dictation was prepared with Dragon dictation along with smaller phrase technology. Any transcriptional errors that result from this process are unintentional.

## 2018-09-11 NOTE — ED Notes (Signed)
ED TO INPATIENT HANDOFF REPORT  ED Nurse Name and Phone #: Metta Clines [phone]  S Name/Age/Gender Deborah Case 85 y.o. female Room/Bed: ED10A/ED10A  Code Status   Code Status: Full Code  Home/SNF/Other Skilled nursing facility Patient oriented to: self and time Is this baseline? Pt usually A&Ox4 but with dementia needs reminder that she is at the hospital; sometimes remembers, other times calls out for [facility] NT as if she is still there  Triage Complete: Triage complete  Chief Complaint Fall   Triage Note Pt tripped and fell hitting her knee on a step (per pt). Pt is from [facility] with hx dementia. Pt also c/o rt arm pain. Has multiple bruises in multiple stages of healing. Let knee skin tear, wrapped by ems.   Allergies Allergies  Allergen Reactions  . Aspirin Other (See Comments)    On plavix.  . Donepezil Other (See Comments)    Elevated bp  . Metformin Other (See Comments)  . Metformin And Related     Level of Care/Admitting Diagnosis ED Disposition    ED Disposition Condition [facility] Area: [place] [[id number]]  Level of Care: Med-Surg [16]  Diagnosis: AKI (acute kidney injury) [facility]) [[id number]]  Admitting Physician: Hillary Bow [[id number]]  Attending Physician: Hillary Bow [[id number]]  Estimated length of stay: past midnight tomorrow  Certification:: I certify this patient will need inpatient services for at least 2 midnights  PT Class (Do Not Modify): Inpatient [101]  PT Acc Code (Do Not Modify): Private [1]       B Medical/Surgery History Past Medical History:  Diagnosis Date  . Cancer ([place])    uterine  . CHF (congestive heart failure) ([place])   . Dementia ([place])   . Diabetes mellitus without complication ([place])   . Hypercholesteremia   . Hypertension   . Pulmonary embolism ([place])   . Thyroid disease    Past Surgical History:  Procedure Laterality Date  . ABDOMINAL HYSTERECTOMY       A IV  Location/Drains/Wounds Patient Lines/Drains/Airways Status   Active Line/Drains/Airways    None          Intake/Output Last 24 hours No intake or output data in the 24 hours ending 09/11/18 1719  Labs/Imaging Results for orders placed or performed during the hospital encounter of 09/11/18 (from the past 48 hour(s))  CBC     Status: Abnormal   Collection Time: 09/11/18  3:50 PM  Result Value Ref Range   WBC 5.1 4.0 - 10.5 K/uL   RBC 3.10 (L) 3.87 - 5.11 MIL/uL   Hemoglobin 9.1 (L) 12.0 - 15.0 g/dL   HCT 29.1 (L) 36.0 - 46.0 %   MCV 93.9 80.0 - 100.0 fL   MCH 29.4 26.0 - 34.0 pg   MCHC 31.3 30.0 - 36.0 g/dL   RDW 15.9 (H) 11.5 - 15.5 %   Platelets 122 (L) 150 - 400 K/uL   nRBC 0.0 0.0 - 0.2 %    Comment: Performed at [facility], [address] [place], [place] [address]  Basic metabolic panel     Status: Abnormal   Collection Time: 09/11/18  3:50 PM  Result Value Ref Range   Sodium 137 135 - 145 mmol/L   Potassium 5.5 (H) 3.5 - 5.1 mmol/L   Chloride 103 98 - 111 mmol/L   CO2 25 22 - 32 mmol/L   Glucose, Bld 161 (H) 70 - 99 mg/dL   BUN 58 (H)  8 - 23 mg/dL   Creatinine, Ser 1.58 (H) 0.44 - 1.00 mg/dL   Calcium 9.3 8.9 - 10.3 mg/dL   GFR calc non Af Amer 29 (L) >60 mL/min   GFR calc Af Amer 34 (L) >60 mL/min   Anion gap 9 5 - 15    Comment: Performed at [facility], [address] [place], [place] [address]  CK     Status: None   Collection Time: 09/11/18  3:50 PM  Result Value Ref Range   Total CK 93 38 - 234 U/L    Comment: Performed at [facility], [address] [place], [place] [address]  Urinalysis, Complete w Microscopic     Status: Abnormal   Collection Time: 09/11/18  3:50 PM  Result Value Ref Range   Color, Urine YELLOW (A) YELLOW   APPearance CLOUDY (A) CLEAR   Specific Gravity, Urine 1.012 1.005 - 1.030   pH 6.0 5.0 - 8.0   Glucose, UA NEGATIVE NEGATIVE mg/dL   Hgb urine dipstick NEGATIVE NEGATIVE   Bilirubin Urine  NEGATIVE NEGATIVE   Ketones, ur NEGATIVE NEGATIVE mg/dL   Protein, ur NEGATIVE NEGATIVE mg/dL   Nitrite POSITIVE (A) NEGATIVE   Leukocytes,Ua LARGE (A) NEGATIVE   RBC / HPF 0-5 0 - 5 RBC/hpf   WBC, UA >50 (H) 0 - 5 WBC/hpf   Bacteria, UA RARE (A) NONE SEEN   Squamous Epithelial / LPF 0-5 0 - 5   WBC Clumps PRESENT     Comment: Performed at [facility], [address] [place],  [address]   Dg Knee Complete 4 Views Left  Result Date: 09/11/2018 CLINICAL DATA:  Left knee pain after fall. EXAM: LEFT KNEE - COMPLETE 4+ VIEW COMPARISON:  Radiographs of August 30, 2018. FINDINGS: No evidence of fracture, dislocation, or joint effusion. Joint spaces are intact. Vascular calcifications are noted. Chondrocalcinosis is seen involving the medial and lateral joint spaces. IMPRESSION: Chondrocalcinosis is noted suggesting early degenerative joint disease or possibly calcium pyrophosphate deposition disease. No acute abnormality seen in the left knee. Electronically Signed   By: Marijo Conception, M.D.   On: 09/11/2018 15:45    Pending Labs Unresulted Labs (From admission, onward)    Start     Ordered   09/18/18 0500  Creatinine, serum  (enoxaparin (LOVENOX)    CrCl < 30 ml/min)  Weekly,   STAT    Comments:  while on enoxaparin therapy.    09/11/18 1704   09/12/18 7035  Basic metabolic panel  Tomorrow morning,   STAT     09/11/18 1704   09/12/18 0500  CBC  Tomorrow morning,   STAT     09/11/18 1704          Vitals/Pain Today's Vitals   09/11/18 1530 09/11/18 1615 09/11/18 1630 09/11/18 1656  BP: 133/61  (!) 160/58   Pulse: (!) 52 (!) 58  62  Resp:      Temp:      TempSrc:      SpO2: 100% 100%  100%  Weight:      Height:      PainSc:        Isolation Precautions No active isolations  Medications Medications  QUEtiapine (SEROQUEL) tablet 25 mg (has no administration in time range)  sodium chloride 0.9 % bolus 500 mL (has no administration in time range)  sodium  polystyrene (KAYEXALATE) 15 GM/60ML suspension 15 g (has no administration in time range)  0.9 %  sodium chloride infusion (has no administration in time range)  insulin aspart (novoLOG) injection 0-9 Units (has no administration in time range)  enoxaparin (LOVENOX) injection 30 mg (has no administration in time range)  acetaminophen (TYLENOL) tablet 650 mg (has no administration in time range)    Or  acetaminophen (TYLENOL) suppository 650 mg (has no administration in time range)  polyethylene glycol (MIRALAX / GLYCOLAX) packet 17 g (has no administration in time range)  ondansetron (ZOFRAN) tablet 4 mg (has no administration in time range)    Or  ondansetron (ZOFRAN) injection 4 mg (has no administration in time range)  albuterol (PROVENTIL) (2.5 MG/3ML) 0.083% nebulizer solution 2.5 mg (has no administration in time range)  LORazepam (ATIVAN) tablet 0.5 mg (has no administration in time range)  amLODipine (NORVASC) tablet 2.5 mg (has no administration in time range)    Mobility Walks with person assist & device High fall risk   Focused Assessments Neuro (hist dementia); Fall: pt at CT/Xray now   R Recommendations: See Admitting Provider Note  Report given to:   Additional Notes:  RA, will place IV to provide NS bolus, etc. Please call this RN with questions. Thanks.

## 2018-09-11 NOTE — ED Notes (Addendum)
Pt's legal guardian Trey Paula contacted at [phone]. Left message notifying of pt's being seen for fall and admit to hospital as guardian did not answer phone.

## 2018-09-12 ENCOUNTER — Inpatient Hospital Stay: Payer: Medicare Other

## 2018-09-12 LAB — BASIC METABOLIC PANEL
Anion gap: 7 (ref 5–15)
BUN: 51 mg/dL — ABNORMAL HIGH (ref 8–23)
CO2: 24 mmol/L (ref 22–32)
Calcium: 8 mg/dL — ABNORMAL LOW (ref 8.9–10.3)
Chloride: 108 mmol/L (ref 98–111)
Creatinine, Ser: 1.05 mg/dL — ABNORMAL HIGH (ref 0.44–1.00)
GFR calc Af Amer: 56 mL/min — ABNORMAL LOW (ref >60)
GFR calc non Af Amer: 48 mL/min — ABNORMAL LOW (ref >60)
Glucose, Bld: 101 mg/dL — ABNORMAL HIGH (ref 70–99)
Potassium: 4.6 mmol/L (ref 3.5–5.1)
Sodium: 139 mmol/L (ref 135–145)

## 2018-09-12 LAB — GLUCOSE, CAPILLARY
Glucose-Capillary: 98 mg/dL (ref 70–99)
Glucose-Capillary: 163 mg/dL — ABNORMAL HIGH (ref 70–99)
Glucose-Capillary: 108 mg/dL — ABNORMAL HIGH (ref 70–99)
Glucose-Capillary: 142 mg/dL — ABNORMAL HIGH (ref 70–99)

## 2018-09-12 LAB — CBC
HCT: 26 % — ABNORMAL LOW (ref 36.0–46.0)
Hemoglobin: 7.9 g/dL — ABNORMAL LOW (ref 12.0–15.0)
MCH: 29 pg (ref 26.0–34.0)
MCHC: 30.4 g/dL (ref 30.0–36.0)
MCV: 95.6 fL (ref 80.0–100.0)
Platelets: 121 10*3/uL — ABNORMAL LOW (ref 150–400)
RBC: 2.72 MIL/uL — ABNORMAL LOW (ref 3.87–5.11)
RDW: 15.9 % — ABNORMAL HIGH (ref 11.5–15.5)
WBC: 4.9 10*3/uL (ref 4.0–10.5)
nRBC: 0 % (ref 0.0–0.2)

## 2018-09-12 MED ORDER — GUAIFENESIN ER 600 MG PO TB12: 600.0000 mg | ORAL_TABLET | Freq: Two times a day (BID) | ORAL | Status: DC | PRN

## 2018-09-12 MED ORDER — POLYETHYLENE GLYCOL 3350 17 G PO PACK
17.0000 g | PACK | Freq: Every day | ORAL | Status: DC
  Administered 2018-09-12 – 2018-09-13 (×2): 17 g via ORAL
  Filled 2018-09-12 (×2): qty 1

## 2018-09-12 MED ORDER — ENOXAPARIN SODIUM 40 MG/0.4ML ~~LOC~~ SOLN
40.0000 mg | SUBCUTANEOUS | Status: DC
  Administered 2018-09-12: 19:00:00 40 mg via SUBCUTANEOUS
  Filled 2018-09-12: qty 0.4

## 2018-09-12 NOTE — Progress Notes (Addendum)
[place] [facility] [place] NAME: Emmalene Kattner    MR#:  [id number]  DATE OF BIRTH:  [birth year]  SUBJECTIVE:   Patient states she is feeling pretty good this morning.  She denies any chest pain, shortness of breath, palpitations.  She does endorse cough and "feels like phlegm is getting stuck in her throat".  She denies any fevers or chills.  REVIEW OF SYSTEMS:  Review of Systems  Constitutional: Negative for chills and fever.  HENT: Negative for congestion and sore throat.   Eyes: Negative for blurred vision and double vision.  Respiratory: Positive for cough and sputum production. Negative for shortness of breath.   Cardiovascular: Negative for chest pain and palpitations.  Gastrointestinal: Negative for nausea and vomiting.  Genitourinary: Negative for dysuria and urgency.  Musculoskeletal: Negative for back pain and neck pain.  Neurological: Negative for dizziness and headaches.  Psychiatric/Behavioral: Negative for depression. The patient is not nervous/anxious.     DRUG ALLERGIES:   Allergies  Allergen Reactions   Aspirin Other (See Comments)    On plavix.   Donepezil Other (See Comments)    Elevated bp   Metformin Other (See Comments)   Metformin And Related    VITALS:  Blood pressure (!) 111/42, pulse 69, temperature 97.9 F (36.6 C), temperature source Oral, resp. rate 18, height 5\' 2"  (1.575 m), weight 67.7 kg, SpO2 (!) 83 %. PHYSICAL EXAMINATION:  Physical Exam  GENERAL:  Laying in the bed with no acute distress.  Thin appearing. HEENT: Head atraumatic, normocephalic. Pupils equal, round, reactive to light and accommodation. No scleral icterus. Extraocular muscles intact. Oropharynx and nasopharynx clear.  NECK:  Supple, no jugular venous distention. No thyroid enlargement. LUNGS: Lungs are clear to auscultation bilaterally. No wheezes, crackles, rhonchi. No use of accessory muscles of respiration. + Nasal cannula in  place. CARDIOVASCULAR: RRR, S1, S2 normal. No murmurs, rubs, or gallops.  ABDOMEN: Soft, nontender, nondistended. Bowel sounds present.  EXTREMITIES: No pedal edema, cyanosis, or clubbing.  NEUROLOGIC: CN 2-12 intact, no focal deficits. 5/5 muscle strength throughout all extremities. Sensation intact throughout. Gait not checked.  PSYCHIATRIC: The patient is alert and oriented x 3.  SKIN: No obvious rash, lesion, or ulcer.  LABORATORY PANEL:  Female CBC Recent Labs  Lab 09/12/18 0359  WBC 4.9  HGB 7.9*  HCT 26.0*  PLT 121*   ------------------------------------------------------------------------------------------------------------------ Chemistries  Recent Labs  Lab 09/12/18 0359  NA 139  K 4.6  CL 108  CO2 24  GLUCOSE 101*  BUN 51*  CREATININE 1.05*  CALCIUM 8.0*   RADIOLOGY:  Dg Chest 1 View  Result Date: 09/12/2018 CLINICAL DATA:  Cough EXAM: CHEST  1 VIEW COMPARISON:  08/13/2018; 07/13/2018; chest CT-09/05/2018 FINDINGS: Unchanged cardiac silhouette and mediastinal contours. Atherosclerotic plaque when the thoracic aorta. Post coronary stent placement. No discrete focal airspace opacities. No pleural effusion or pneumothorax. No evidence of edema. No acute osseus abnormalities. Degenerative change of the bilateral AC joints, incompletely evaluated. IMPRESSION: No acute cardiopulmonary disease. Electronically Signed   By: Sandi Mariscal M.D.   On: 09/12/2018 10:18   Ct Head Wo Contrast  Result Date: 09/11/2018 CLINICAL DATA:  Patient tripped and fell. Multiple bruises. History of dementia. EXAM: CT HEAD WITHOUT CONTRAST CT CERVICAL SPINE WITHOUT CONTRAST TECHNIQUE: Multidetector CT imaging of the head and cervical spine was performed following the standard protocol without intravenous contrast. Multiplanar CT image reconstructions of the cervical spine were also generated. COMPARISON:  Prior  CTs 09/05/2018. FINDINGS: CT HEAD FINDINGS Brain: There is no evidence of acute  intracranial hemorrhage, mass lesion, brain edema or extra-axial fluid collection. The ventricles and subarachnoid spaces are appropriately sized for age. There are stable mild chronic small vessel ischemic changes in the periventricular white matter. Vascular: Diffuse intracranial vascular calcifications. No hyperdense vessel identified. Skull: Negative for fracture or focal lesion. Sinuses/Orbits: The visualized paranasal sinuses and mastoid air cells are clear. No orbital abnormalities are seen. Other: None. CT CERVICAL SPINE FINDINGS Alignment: Stable near anatomic alignment. No focal angulation or listhesis. Skull base and vertebrae: No evidence of acute fracture or traumatic subluxation. Soft tissues and spinal canal: No prevertebral fluid or swelling. No visible canal hematoma. Disc levels: No large disc herniation. Stable spondylosis, greatest at C6-7. Calcified pannus surrounding the odontoid process again noted. Upper chest: Mild scarring at the right lung apex. Other: Bilateral carotid atherosclerosis. IMPRESSION: 1. No acute intracranial or calvarial findings. 2. No evidence of acute cervical spine fracture, traumatic subluxation or static signs of instability. Electronically Signed   By: Richardean Sale M.D.   On: 09/11/2018 17:23   Ct Cervical Spine Wo Contrast  Result Date: 09/11/2018 CLINICAL DATA:  Patient tripped and fell. Multiple bruises. History of dementia. EXAM: CT HEAD WITHOUT CONTRAST CT CERVICAL SPINE WITHOUT CONTRAST TECHNIQUE: Multidetector CT imaging of the head and cervical spine was performed following the standard protocol without intravenous contrast. Multiplanar CT image reconstructions of the cervical spine were also generated. COMPARISON:  Prior CTs 09/05/2018. FINDINGS: CT HEAD FINDINGS Brain: There is no evidence of acute intracranial hemorrhage, mass lesion, brain edema or extra-axial fluid collection. The ventricles and subarachnoid spaces are appropriately sized for age.  There are stable mild chronic small vessel ischemic changes in the periventricular white matter. Vascular: Diffuse intracranial vascular calcifications. No hyperdense vessel identified. Skull: Negative for fracture or focal lesion. Sinuses/Orbits: The visualized paranasal sinuses and mastoid air cells are clear. No orbital abnormalities are seen. Other: None. CT CERVICAL SPINE FINDINGS Alignment: Stable near anatomic alignment. No focal angulation or listhesis. Skull base and vertebrae: No evidence of acute fracture or traumatic subluxation. Soft tissues and spinal canal: No prevertebral fluid or swelling. No visible canal hematoma. Disc levels: No large disc herniation. Stable spondylosis, greatest at C6-7. Calcified pannus surrounding the odontoid process again noted. Upper chest: Mild scarring at the right lung apex. Other: Bilateral carotid atherosclerosis. IMPRESSION: 1. No acute intracranial or calvarial findings. 2. No evidence of acute cervical spine fracture, traumatic subluxation or static signs of instability. Electronically Signed   By: Richardean Sale M.D.   On: 09/11/2018 17:23   Dg Humerus Right  Result Date: 09/11/2018 CLINICAL DATA:  Fall today with upper right arm pain EXAM: RIGHT HUMERUS - 2+ VIEW COMPARISON:  09/07/2018 shoulder radiograph FINDINGS: There is no evidence of fracture or other focal bone lesions. Acromioclavicular osteoarthritis. Osteopenia. IMPRESSION: No acute finding Electronically Signed   By: Monte Fantasia M.D.   On: 09/11/2018 17:33   ASSESSMENT AND PLAN:   Acute kidney injury-greatly improved with IV fluids -Stop IV fluids and encourage p.o. intake -Lisinopril has been discontinued  Hyperkalemia-resolved  Cough-patient denies any shortness of breath, fevers, chills so doubt pneumonia -Check chest x-ray -Start Mucinex  UTI versus asymptomatic bacteriuria-patient without any urinary symptoms.  Not meeting sepsis criteria. -Continue IV ceftriaxone for  now -Follow-up urine culture  Normocytic anemia-hemoglobin is a little worse than baseline.  No active bleeding. -Check anemia panel -Monitor  Hypertension-blood pressures well controlled -  Lisinopril has been discontinued -Continue Norvasc  Chronic diastolic CHF.  No signs of fluid overload. -Monitor  Dementia.  -Monitor for inpatient delirium.  Type II diabetes mellitus-blood sugars well controlled -SSI  DVT prophylaxis with Lovenox  All the records are reviewed and case discussed with Care Management/Social Worker. Management plans discussed with the patient, family and they are in agreement.  CODE STATUS: Full Code  TOTAL TIME TAKING CARE OF THIS PATIENT: 40 minutes.   More than 50% of the time was spent in counseling/coordination of care: YES  POSSIBLE D/C IN 1-2 DAYS, DEPENDING ON CLINICAL CONDITION.   Berna Spare Chantae Soo M.D on 09/12/2018 at 3:14 PM  Between 7am to 6pm - Pager - [phone]  After 6pm go to [email or web address] - Proofreader  [facility]  Office  [phone]  CC: Primary care physician; Hortencia Pilar, MD  Note: This dictation was prepared with Dragon dictation along with smaller phrase technology. Any transcriptional errors that result from this process are unintentional.

## 2018-09-12 NOTE — Evaluation (Signed)
Physical Therapy Evaluation Patient Details Name: Deborah Case MRN: [id number] DOB: 05-Jun-1929 Today's Date: 09/12/2018   History of Present Illness  Pt is 85 yo female presented to ED s/p fall, from [facility] memory care unit. PMH of HTN, DM, dementia, CHF, pulmonary embolism. Workup showed AKI, hyperkalemia, UTI. Recently admitted for falls, rhabdomyolsis, AKI, and hyperkalemia.     Clinical Impression  Patient oriented to self only, no pain initially but indicated L knee pain with recliner repositioned. Pt unable to provide PLOF information, information gathered per chart review. Of note, patient has also had several admission in the last couple of months for falls.   Patient needed constant redirecting during session, tangential conversation often noted, and some emotional lability present as well, though pt overall pleasant. Demonstrated sit <> stand with RW and CGA, cues to enhance safety via proper hand placement necessary. The patient ambulated ~12ft with RW and CGA for safety, intermittent minA for RW assistance. Pt tended to ambulate with RW outside BOS, reluctant to move closer. Pt complained of fatigue, exhibited decreased gait velocity. No LOB noted during mobility. Unable to obtain an spO2 reading throughout mobility, on 2L via [place] for safety. Overall the patient demonstrated deficits (see "PT Problem List") that impede the patient's functional abilities, safety, and mobility and would benefit from skilled PT intervention. Recommendation is HHPT with supervision/assistance 24/7, as well as OT/RN/aide as able to improve safety and decrease risk of falls.     Follow Up Recommendations Home health PT;Supervision/Assistance - 24 hour(OT/RN/aide)    Equipment Recommendations  Rolling walker with 5" wheels    Recommendations for Other Services OT consult     Precautions / Restrictions Precautions Precautions: Fall;Other (comment) Precaution Comments:  disoriented Restrictions Weight Bearing Restrictions: No      Mobility  Bed Mobility               General bed mobility comments: deferred up in chair  Transfers Overall transfer level: Needs assistance Equipment used: Rolling walker (2 wheeled) Transfers: Sit to/from Stand Sit to Stand: Min guard         General transfer comment: Use of UE and pulling on AD, improvement in safety noted with verbal and visual cues for hand placement. CGA for safety.  Ambulation/Gait Ambulation/Gait assistance: Min assist Gait Distance (Feet): 80 Feet Assistive device: Rolling walker (2 wheeled) Gait Pattern/deviations: Trunk flexed;Decreased stride length     General Gait Details: Pt tended to ambulate with RW outside BOS, stated she "didn't want to fall on it" by keeping it to close. Constant redirecting needed to increase ambulation distance/time.  Stairs            Wheelchair Mobility    Modified Rankin (Stroke Patients Only)       Balance Overall balance assessment: Needs assistance Sitting-balance support: No upper extremity supported;Feet unsupported Sitting balance-Leahy Scale: Fair     Standing balance support: Bilateral upper extremity supported Standing balance-Leahy Scale: Poor Standing balance comment: minA for RW assistance, CGA for safety                             Pertinent Vitals/Pain Pain Assessment: Faces Faces Pain Scale: Hurts a little bit Pain Location: points to L knee when sat up in recliner Pain Intervention(s): Monitored during session;Repositioned    Home Living Family/patient expects to be discharged to:: Skilled nursing facility  Prior Function Level of Independence: Needs assistance         Comments: in memory care, apparently ambulated prior to admission. Unclear if she used AD or not. Pt not a reliable historian, unable to obtain baseline from her.      Hand Dominance         Extremity/Trunk Assessment   Upper Extremity Assessment Upper Extremity Assessment: Generalized weakness    Lower Extremity Assessment Lower Extremity Assessment: Generalized weakness    Cervical / Trunk Assessment Cervical / Trunk Assessment: Kyphotic  Communication   Communication: (disoriented, but pleasant majority of time. Some emotional lability noted)  Cognition Arousal/Alertness: Awake/alert   Overall Cognitive Status: History of cognitive impairments - at baseline                                 General Comments: history of alzheimers according to chart review lives in memory care. Oriented to self only      General Comments      Exercises     Assessment/Plan    PT Assessment Patient needs continued PT services  PT Problem List Decreased strength;Decreased safety awareness;Decreased mobility;Decreased knowledge of precautions;Decreased activity tolerance;Decreased cognition;Decreased balance;Decreased knowledge of use of DME;Pain       PT Treatment Interventions DME instruction;Functional mobility training;Gait training;Therapeutic activities;Neuromuscular re-education;Balance training;Patient/family education;Therapeutic exercise;Stair training    PT Goals (Current goals can be found in the Care Plan section)  Acute Rehab PT Goals Patient Stated Goal: to go home PT Goal Formulation: With patient Time For Goal Achievement: 09/26/18 Potential to Achieve Goals: Fair    Frequency Min 2X/week   Barriers to discharge        Co-evaluation               AM-PAC PT "6 Clicks" Mobility  Outcome Measure Help needed turning from your back to your side while in a flat bed without using bedrails?: A Little Help needed moving from lying on your back to sitting on the side of a flat bed without using bedrails?: A Little Help needed moving to and from a bed to a chair (including a wheelchair)?: A Little Help needed standing up from a chair using  your arms (e.g., wheelchair or bedside chair)?: A Little Help needed to walk in hospital room?: A Little Help needed climbing 3-5 steps with a railing? : Total 6 Click Score: 16    End of Session Equipment Utilized During Treatment: Gait belt;Oxygen(2L) Activity Tolerance: Patient tolerated treatment well Patient left: in chair;with call bell/phone within reach;with chair alarm set Nurse Communication: Mobility status(oxygen status) PT Visit Diagnosis: Unsteadiness on feet (R26.81);Muscle weakness (generalized) (M62.81);History of falling (Z91.81);Difficulty in walking, not elsewhere classified (R26.2)    Time: 5053-9767 PT Time Calculation (min) (ACUTE ONLY): 27 min   Charges:   PT Evaluation $PT Eval Moderate Complexity: 1 Mod PT Treatments $Therapeutic Activity: 8-22 mins        Lieutenant Diego PT, DPT 3:49 PM,09/12/18 [phone]

## 2018-09-13 LAB — BASIC METABOLIC PANEL
Anion gap: 8 (ref 5–15)
BUN: 43 mg/dL — ABNORMAL HIGH (ref 8–23)
CO2: 23 mmol/L (ref 22–32)
Calcium: 8.4 mg/dL — ABNORMAL LOW (ref 8.9–10.3)
Chloride: 109 mmol/L (ref 98–111)
Creatinine, Ser: 0.98 mg/dL (ref 0.44–1.00)
GFR calc Af Amer: >60 mL/min (ref >60)
GFR calc non Af Amer: 52 mL/min — ABNORMAL LOW (ref >60)
Glucose, Bld: 108 mg/dL — ABNORMAL HIGH (ref 70–99)
Potassium: 4.8 mmol/L (ref 3.5–5.1)
Sodium: 140 mmol/L (ref 135–145)

## 2018-09-13 LAB — RETICULOCYTES
Immature Retic Fract: 14.7 % (ref 2.3–15.9)
RBC.: 2.77 MIL/uL — ABNORMAL LOW (ref 3.87–5.11)
Retic Count, Absolute: 66.2 10*3/uL (ref 19.0–186.0)
Retic Ct Pct: 2.4 % (ref 0.4–3.1)

## 2018-09-13 LAB — CBC
HCT: 26.4 % — ABNORMAL LOW (ref 36.0–46.0)
Hemoglobin: 8.2 g/dL — ABNORMAL LOW (ref 12.0–15.0)
MCH: 29.6 pg (ref 26.0–34.0)
MCHC: 31.1 g/dL (ref 30.0–36.0)
MCV: 95.3 fL (ref 80.0–100.0)
Platelets: 117 10*3/uL — ABNORMAL LOW (ref 150–400)
RBC: 2.77 MIL/uL — ABNORMAL LOW (ref 3.87–5.11)
RDW: 15.9 % — ABNORMAL HIGH (ref 11.5–15.5)
WBC: 4.7 10*3/uL (ref 4.0–10.5)
nRBC: 0 % (ref 0.0–0.2)

## 2018-09-13 LAB — URINE CULTURE: Culture: >=100000 — AB

## 2018-09-13 LAB — IRON AND TIBC
Iron: 29 ug/dL (ref 28–170)
Saturation Ratios: 11 % (ref 10.4–31.8)
TIBC: 254 ug/dL (ref 250–450)
UIBC: 225 ug/dL

## 2018-09-13 LAB — FOLATE: Folate: 8.4 ng/mL (ref >5.9)

## 2018-09-13 LAB — GLUCOSE, CAPILLARY
Glucose-Capillary: 90 mg/dL (ref 70–99)
Glucose-Capillary: 121 mg/dL — ABNORMAL HIGH (ref 70–99)

## 2018-09-13 LAB — VITAMIN B12: Vitamin B-12: 216 pg/mL (ref 180–914)

## 2018-09-13 LAB — FERRITIN: Ferritin: 85 ng/mL (ref 11–307)

## 2018-09-13 MED ORDER — AMLODIPINE BESYLATE 2.5 MG PO TABS: 2.5000 mg | ORAL_TABLET | Freq: Every day | ORAL | 0 refills | Status: DC

## 2018-09-13 MED ORDER — CEPHALEXIN 500 MG PO CAPS: 500.0000 mg | ORAL_CAPSULE | Freq: Two times a day (BID) | ORAL | 0 refills | Status: DC

## 2018-09-13 MED ORDER — CEPHALEXIN 500 MG PO CAPS
500.0000 mg | ORAL_CAPSULE | Freq: Two times a day (BID) | ORAL | Status: DC
  Administered 2018-09-13: 12:00:00 500 mg via ORAL
  Filled 2018-09-13: qty 1

## 2018-09-13 NOTE — NC FL2 (Signed)
[place] LEVEL OF CARE SCREENING TOOL     IDENTIFICATION  Patient Name: Deborah Case Birthdate: 01-02-1930 Sex: female Admission Date (Current Location): 09/11/2018  [place] and [place] Number:  Engineering geologist and Address:  [facility], [address], [place],  [address]      Provider Number: [id number]  Attending Physician Name and Address:  Mayo, Pete Pelt, MD  Relative Name and Phone Number:       Current Level of Care: Hospital Recommended Level of Care: [place], Memory Care Prior Approval Number:    Date Approved/Denied:   PASRR Number:    Discharge Plan: (ALF Memory Care)    Current Diagnoses: Dementia Patient Active Problem List   Diagnosis Date Noted  . AKI (acute kidney injury) ([place]) 09/05/2018    Orientation RESPIRATION BLADDER Height & Weight     Self, Place  Normal Continent Weight: 149 lb 4 oz (67.7 kg) Height:  5\' 2"  (157.5 cm)  BEHAVIORAL SYMPTOMS/MOOD NEUROLOGICAL BOWEL NUTRITION STATUS  (none) (none) Continent Diet(low na; heart healthy)  AMBULATORY STATUS COMMUNICATION OF NEEDS Skin   Limited Assist Verbally Normal                       Personal Care Assistance Level of Assistance  Dressing, Feeding, Bathing Bathing Assistance: Limited assistance Feeding assistance: Limited assistance Dressing Assistance: Limited assistance     Functional Limitations Info  (no issues documented)          SPECIAL CARE FACTORS FREQUENCY  (hospice to resume)                    Contractures Contractures Info: Not present    Additional Factors Info  Code Status Code Status Info: full             Medication List        STOP taking these medications       lisinopril 10 MG tablet Commonly known as:  PRINIVIL,ZESTRIL             TAKE these medications       acetaminophen 500 MG tablet Commonly known as:  TYLENOL Take 500 mg by mouth every 4 (four)  hours as needed for mild pain or moderate pain.   amLODipine 2.5 MG tablet Commonly known as:  NORVASC Take 1 tablet (2.5 mg total) by mouth daily.   atorvastatin 80 MG tablet Commonly known as:  LIPITOR Take 80 mg by mouth at bedtime.   carvedilol 6.25 MG tablet Commonly known as:  COREG Take 6.25 mg by mouth daily.   cephALEXin 500 MG capsule Commonly known as:  KEFLEX Take 1 capsule (500 mg total) by mouth 2 (two) times daily. For 4 more days   clopidogrel 75 MG tablet Commonly known as:  PLAVIX Take 75 mg by mouth daily.   guaifenesin 100 MG/5ML syrup Commonly known as:  ROBITUSSIN Take 200 mg by mouth every 6 (six) hours as needed for cough.   levothyroxine 88 MCG tablet Commonly known as:  SYNTHROID, LEVOTHROID Take 88 mcg by mouth daily before breakfast.   loperamide 2 MG capsule Commonly known as:  IMODIUM Take 2 mg by mouth as needed for diarrhea or loose stools. (max 8 doses in 24 hours)   magnesium hydroxide 400 MG/5ML suspension Commonly known as:  MILK OF MAGNESIA Take 30 mLs by mouth daily as needed for mild constipation.   Mintox 200-200-20 MG/5ML suspension  Generic drug:  alum & mag hydroxide-simeth Take 30 mLs by mouth every 6 (six) hours as needed for indigestion or heartburn.   neomycin-bacitracin-polymyxin ointment Commonly known as:  NEOSPORIN Apply 1 application topically every 12 (twelve) hours.   QUEtiapine 25 MG tablet Commonly known as:  SEROQUEL Take 1-2 tablets (25-50 mg total) by mouth 2 (two) times daily. Take one tablet (25 mg) by mouth in the morning and two tablets (50 mg) at bedtime                  Additional Information    Shela Leff, LCSW

## 2018-09-13 NOTE — Discharge Instructions (Signed)
Acute Kidney Injury, Adult ° °Acute kidney injury is a sudden worsening of kidney function. The kidneys are organs that have several jobs. They filter the blood to remove waste products and extra fluid. They also maintain a healthy balance of minerals and hormones in the body, which helps control blood pressure and keep bones strong. With this condition, your kidneys do not do their jobs as well as they should. °This condition ranges from mild to severe. Over time it may develop into long-lasting (chronic) kidney disease. Early detection and treatment may prevent acute kidney injury from developing into a chronic condition. °What are the causes? °Common causes of this condition include: °· A problem with blood flow to the kidneys. This may be caused by: °? Low blood pressure (hypotension) or shock. °? Blood loss. °? Heart and blood vessel (cardiovascular) disease. °? Severe burns. °? Liver disease. °· Direct damage to the kidneys. This may be caused by: °? Certain medicines. °? A kidney infection. °? Poisoning. °? Being around or in contact with toxic substances. °? A surgical wound. °? A hard, direct hit to the kidney area. °· A sudden blockage of urine flow. This may be caused by: °? Cancer. °? Kidney stones. °? An enlarged prostate in males. °What are the signs or symptoms? °Symptoms of this condition may not be obvious until the condition becomes severe. Symptoms of this condition can include: °· Tiredness (lethargy), or difficulty staying awake. °· Nausea or vomiting. °· Swelling (edema) of the face, legs, ankles, or feet. °· Problems with urination, such as: °? Abdominal pain, or pain along the side of your stomach (flank). °? Decreased urine production. °? Decrease in the force of urine flow. °· Muscle twitches and cramps, especially in the legs. °· Confusion or trouble concentrating. °· Loss of appetite. °· Fever. °How is this diagnosed? °This condition may be diagnosed with tests, including: °· Blood  tests. °· Urine tests. °· Imaging tests. °· A test in which a sample of tissue is removed from the kidneys to be examined under a microscope (kidney biopsy). °How is this treated? °Treatment for this condition depends on the cause and how severe the condition is. In mild cases, treatment may not be needed. The kidneys may heal on their own. In more severe cases, treatment will involve: °· Treating the cause of the kidney injury. This may involve changing any medicines you are taking or adjusting your dosage. °· Fluids. You may need specialized IV fluids to balance your body's needs. °· Having a catheter placed to drain urine and prevent blockages. °· Preventing problems from occurring. This may mean avoiding certain medicines or procedures that can cause further injury to the kidneys. °In some cases treatment may also require: °· A procedure to remove toxic wastes from the body (dialysis or continuous renal replacement therapy - CRRT). °· Surgery. This may be done to repair a torn kidney, or to remove the blockage from the urinary system. °Follow these instructions at home: °Medicines °· Take over-the-counter and prescription medicines only as told by your health care provider. °· Do not take any new medicines without your health care provider's approval. Many medicines can worsen your kidney damage. °· Do not take any vitamin and mineral supplements without your health care provider's approval. Many nutritional supplements can worsen your kidney damage. °Lifestyle °· If your health care provider prescribed changes to your diet, follow them. You may need to decrease the amount of protein you eat. °· Achieve and maintain a healthy   weight. If you need help with this, ask your health care provider. °· Start or continue an exercise plan. Try to exercise at least 30 minutes a day, 5 days a week. °· Do not use any tobacco products, such as cigarettes, chewing tobacco, and e-cigarettes. If you need help quitting, ask your  health care provider. °General instructions °· Keep track of your blood pressure. Report changes in your blood pressure as told by your health care provider. °· Stay up to date with immunizations. Ask your health care provider which immunizations you need. °· Keep all follow-up visits as told by your health care provider. This is important. °Where to find more information °· American Association of Kidney Patients: www.aakp.org °· National Kidney Foundation: www.kidney.org °· American Kidney Fund: www.akfinc.org °· Life Options Rehabilitation Program: °? www.lifeoptions.org °? www.kidneyschool.org °Contact a health care provider if: °· Your symptoms get worse. °· You develop new symptoms. °Get help right away if: °· You develop symptoms of worsening kidney disease, which include: °? Headaches. °? Abnormally dark or light skin. °? Easy bruising. °? Frequent hiccups. °? Chest pain. °? Shortness of breath. °? End of menstruation in women. °? Seizures. °? Confusion or altered mental status. °? Abdominal or back pain. °? Itchiness. °· You have a fever. °· Your body is producing less urine. °· You have pain or bleeding when you urinate. °Summary °· Acute kidney injury is a sudden worsening of kidney function. °· Acute kidney injury can be caused by problems with blood flow to the kidneys, direct damage to the kidneys, and sudden blockage of urine flow. °· Symptoms of this condition may not be obvious until it becomes severe. Symptoms may include edema, lethargy, confusion, nausea or vomiting, and problems passing urine. °· This condition can usually be diagnosed with blood tests, urine tests, and imaging tests. Sometimes a kidney biopsy is done to diagnose this condition. °· Treatment for this condition often involves treating the underlying cause. It is treated with fluids, medicines, dialysis, diet changes, or surgery. °This information is not intended to replace advice given to you by your health care provider. Make  sure you discuss any questions you have with your health care provider. °Document Released: 12/01/2010 Document Revised: 09/17/2016 Document Reviewed: 05/08/2016 °Elsevier Interactive Patient Education © 2019 Elsevier Inc. ° °

## 2018-09-13 NOTE — TOC Initial Note (Signed)
Transition of Care [facility]) - Initial/Assessment Note    Patient Details  Name: Deborah Case MRN: [id number] Date of Birth: 28-Nov-1929  [facility] CM/SW Contact:    Shela Leff, LCSW Phone Number: 09/13/2018, 11:27 AM  Clinical Narrative:     Patient discharging today to return to [place]. CSW has contacted patient's guardian through [place]: Trey Paula: [phone]. CSW has faxed the discharge information to Ms. Edison Pace. Cindy at [employer] informed CSW that patient has [facility] following and will resume care. Jenny Reichmann has requested patient return via EMS.           Expected Discharge Plan: Assisted Living Barriers to Discharge: No Barriers Identified   Patient Goals and CMS Choice        Expected Discharge Plan and Services Expected Discharge Plan: Assisted Living       Living arrangements for the past 2 months: [place] Expected Discharge Date: 09/13/18                        Prior Living Arrangements/Services Living arrangements for the past 2 months: [place] Lives with:: Facility Resident Patient language and need for interpreter reviewed:: Yes        Need for Family Participation in Patient Care: Yes (Comment) Care giver support system in place?: Yes (comment) Current home services: Home RN, Home PT([facility]) Criminal Activity/Legal Involvement Pertinent to Current Situation/Hospitalization: No - Comment as needed  Activities of Daily Living Home Assistive Devices/Equipment: Environmental consultant (specify type) ADL Screening (condition at time of admission) Patient's cognitive ability adequate to safely complete daily activities?: No Is the patient deaf or have difficulty hearing?: No Does the patient have difficulty seeing, even when wearing glasses/contacts?: No Does the patient have difficulty concentrating, remembering, or making decisions?:  Yes Patient able to express need for assistance with ADLs?: No Does the patient have difficulty dressing or bathing?: Yes Independently performs ADLs?: No Communication: Needs assistance Is this a change from baseline?: Pre-admission baseline Dressing (OT): Needs assistance Is this a change from baseline?: Pre-admission baseline Grooming: Needs assistance Is this a change from baseline?: Pre-admission baseline Feeding: Needs assistance Is this a change from baseline?: Pre-admission baseline Bathing: Needs assistance Is this a change from baseline?: Pre-admission baseline Toileting: Needs assistance Is this a change from baseline?: Pre-admission baseline In/Out Bed: Needs assistance Is this a change from baseline?: Pre-admission baseline Walks in Home: Needs assistance Is this a change from baseline?: Pre-admission baseline Does the patient have difficulty walking or climbing stairs?: Yes Weakness of Legs: Both Weakness of Arms/Hands: Both  Permission Sought/Granted Permission sought to share information with : (Patient has guardian through [facility]; patient with dementia in ALF memory care)                Emotional Assessment Appearance:: Appears stated age   Affect (typically observed): Calm Orientation: : Oriented to Self Alcohol / Substance Use: Not Applicable Psych Involvement: Yes (comment)  Admission diagnosis:  Dehydration [E86.0] AKI (acute kidney injury) ([place]) [N17.9] Fall, initial encounter [W19.XXXA] Skin tear of left lower leg without complication, initial encounter [O53.664Q] Patient Active Problem List   Diagnosis Date Noted  . AKI (acute kidney injury) ([place]) 09/05/2018   PCP:  Hortencia Pilar, MD Pharmacy:   [facility], [place] [address] [place] [address] Phone: [phone] Fax: [phone]  Social Determinants of Health (SDOH) Interventions    Readmission Risk  Interventions Readmission Risk Prevention Plan 09/13/2018  Transportation Screening Complete  Medication Review Press photographer) Complete  PCP or Specialist appointment within 3-5 days of discharge Complete  HRI or [place] Not Applicable  Some recent data might be hidden

## 2018-09-13 NOTE — Discharge Summary (Signed)
[place] at [place] NAME: Deborah Case    MR#:  [id number]  DATE OF BIRTH:  February 09, 1930  DATE OF ADMISSION:  09/11/2018   ADMITTING PHYSICIAN: Hillary Bow, MD  DATE OF DISCHARGE: 09/13/18  PRIMARY CARE PHYSICIAN: Hortencia Pilar, MD   ADMISSION DIAGNOSIS:  Dehydration [E86.0] AKI (acute kidney injury) ([place]) [N17.9] Fall, initial encounter [W19.XXXA] Skin tear of left lower leg without complication, initial encounter [S81.812A] DISCHARGE DIAGNOSIS:  Active Problems:   AKI (acute kidney injury) ([place])  SECONDARY DIAGNOSIS:   Past Medical History:  Diagnosis Date   Cancer ([place])    uterine   CHF (congestive heart failure) ([place])    Dementia ([place])    Diabetes mellitus without complication ([place])    Hypercholesteremia    Hypertension    Pulmonary embolism ([place])    Thyroid disease    HOSPITAL COURSE:   Deborah Case is an 85 year old female who presented to the ED after a fall at her ALF. In the ED, x-rays were negative for fracture. She was found to have an AKI with hyperkalemia. She was also found to have a UTI. She was admitted for further management.  Acute kidney injury- resolved with IVFs -Lisinopril has been discontinued  Hyperkalemia-resolved  E. Coli UTI  Not meeting sepsis criteria. -Urine culture growing pansensitive E. coli -Treated with Ceftriaxone initially and then switched to keflex bid for 4 more days (total 7 day course)  Normocytic anemia- hemoglobin low but stable. No active bleeding. -Anemia panel unremarkable.  Hypertension-blood pressures well controlled -Lisinopril has been discontinued -Started norvasc daily  Chronic diastolic CHF. No signs of fluid overload. -Monitor  Dementia.  -Patient has been at mental status baseline throughout admission  Type II diabetes mellitus -Blood sugars well-controlled   DISCHARGE CONDITIONS:  E. Coli UTI Normocytic anemia Hypertension Chronic  diastolic CHF Dementia Type 2 diabetes CONSULTS OBTAINED:  None DRUG ALLERGIES:   Allergies  Allergen Reactions   Aspirin Other (See Comments)    On plavix.   Donepezil Other (See Comments)    Elevated bp   Metformin Other (See Comments)   Metformin And Related    DISCHARGE MEDICATIONS:   Allergies as of 09/13/2018      Reactions   Aspirin Other (See Comments)   On plavix.   Donepezil Other (See Comments)   Elevated bp   Metformin Other (See Comments)   Metformin And Related       Medication List    STOP taking these medications   lisinopril 10 MG tablet Commonly known as:  PRINIVIL,ZESTRIL     TAKE these medications   acetaminophen 500 MG tablet Commonly known as:  TYLENOL Take 500 mg by mouth every 4 (four) hours as needed for mild pain or moderate pain.   amLODipine 2.5 MG tablet Commonly known as:  NORVASC Take 1 tablet (2.5 mg total) by mouth daily.   atorvastatin 80 MG tablet Commonly known as:  LIPITOR Take 80 mg by mouth at bedtime.   carvedilol 6.25 MG tablet Commonly known as:  COREG Take 6.25 mg by mouth daily.   cephALEXin 500 MG capsule Commonly known as:  KEFLEX Take 1 capsule (500 mg total) by mouth 2 (two) times daily. For 4 more days   clopidogrel 75 MG tablet Commonly known as:  PLAVIX Take 75 mg by mouth daily.   guaifenesin 100 MG/5ML syrup Commonly known as:  ROBITUSSIN Take 200 mg by mouth every 6 (six) hours as  needed for cough.   levothyroxine 88 MCG tablet Commonly known as:  SYNTHROID, LEVOTHROID Take 88 mcg by mouth daily before breakfast.   loperamide 2 MG capsule Commonly known as:  IMODIUM Take 2 mg by mouth as needed for diarrhea or loose stools. (max 8 doses in 24 hours)   magnesium hydroxide 400 MG/5ML suspension Commonly known as:  MILK OF MAGNESIA Take 30 mLs by mouth daily as needed for mild constipation.   Mintox 500-938-18 MG/5ML suspension Generic drug:  alum & mag hydroxide-simeth Take 30 mLs by  mouth every 6 (six) hours as needed for indigestion or heartburn.   neomycin-bacitracin-polymyxin ointment Commonly known as:  NEOSPORIN Apply 1 application topically every 12 (twelve) hours.   QUEtiapine 25 MG tablet Commonly known as:  SEROQUEL Take 1-2 tablets (25-50 mg total) by mouth 2 (two) times daily. Take one tablet (25 mg) by mouth in the morning and two tablets (50 mg) at bedtime            Durable Medical Equipment  (From admission, onward)         Start     Ordered   09/13/18 1019  For home use only DME Walker rolling  Once    Question:  Patient needs a walker to treat with the following condition  Answer:  Generalized weakness   09/13/18 1018           DISCHARGE INSTRUCTIONS:  1. F/u with PCP in 5 days 2. Stop lisinopril and switch to norvasc daily 3. Take Keflex twice a day for 4 more days DIET:  Cardiac diet and Diabetic diet DISCHARGE CONDITION:  Stable ACTIVITY:  Activity as tolerated OXYGEN:  Home Oxygen: No.  Oxygen Delivery: room air DISCHARGE LOCATION:  nursing home   If you experience worsening of your admission symptoms, develop shortness of breath, life threatening emergency, suicidal or homicidal thoughts you must seek medical attention immediately by calling 911 or calling your MD immediately  if symptoms less severe.  You Must read complete instructions/literature along with all the possible adverse reactions/side effects for all the Medicines you take and that have been prescribed to you. Take any new Medicines after you have completely understood and accpet all the possible adverse reactions/side effects.   Please note  You were cared for by a hospitalist during your hospital stay. If you have any questions about your discharge medications or the care you received while you were in the hospital after you are discharged, you can call the unit and asked to speak with the hospitalist on call if the hospitalist that took care of you is not  available. Once you are discharged, your primary care physician will handle any further medical issues. Please note that NO REFILLS for any discharge medications will be authorized once you are discharged, as it is imperative that you return to your primary care physician (or establish a relationship with a primary care physician if you do not have one) for your aftercare needs so that they can reassess your need for medications and monitor your lab values.    On the day of Discharge:  VITAL SIGNS:  Blood pressure (!) 136/49, pulse 81, temperature 98.2 F (36.8 C), temperature source Oral, resp. rate 19, height 5\' 2"  (1.575 m), weight 67.7 kg, SpO2 99 %. PHYSICAL EXAMINATION:  GENERAL:  85 y.o.-year-old patient lying in the bed with no acute distress. Thin-appearing. EYES: Pupils equal, round, reactive to light and accommodation. No scleral icterus. Extraocular muscles intact.  HEENT: Head atraumatic, normocephalic. Oropharynx and nasopharynx clear.  NECK:  Supple, no jugular venous distention. No thyroid enlargement, no tenderness.  LUNGS: Normal breath sounds bilaterally, no wheezing, rales,rhonchi or crepitation. No use of accessory muscles of respiration.  CARDIOVASCULAR: RRR, S1, S2 normal. No murmurs, rubs, or gallops.  ABDOMEN: Soft, non-tender, non-distended. Bowel sounds present. No organomegaly or mass.  EXTREMITIES: No pedal edema, cyanosis, or clubbing.  NEUROLOGIC: Cranial nerves II through XII are intact. Muscle strength 5/5 in all extremities. Sensation intact. Gait not checked.  PSYCHIATRIC: The patient is alert and oriented x 3.  SKIN: No obvious rash, lesion, or ulcer.  DATA REVIEW:   CBC Recent Labs  Lab 09/13/18 0308  WBC 4.7  HGB 8.2*  HCT 26.4*  PLT 117*    Chemistries  Recent Labs  Lab 09/13/18 0308  NA 140  K 4.8  CL 109  CO2 23  GLUCOSE 108*  BUN 43*  CREATININE 0.98  CALCIUM 8.4*     Microbiology Results  Results for orders placed or performed  during the hospital encounter of 09/11/18  Urine Culture     Status: Abnormal   Collection Time: 09/11/18  3:50 PM  Result Value Ref Range Status   Specimen Description   Final    URINE, RANDOM Performed at [facility], [address], [place], [place] [address]    Special Requests   Final    NONE Performed at [facility], [place], [place] [address]    Culture >=100,000 COLONIES/mL ESCHERICHIA COLI (A)  Final   Report Status 09/13/2018 FINAL  Final   Organism ID, Bacteria ESCHERICHIA COLI (A)  Final      Susceptibility   Escherichia coli - MIC*    AMPICILLIN 8 SENSITIVE Sensitive     CEFAZOLIN <=4 SENSITIVE Sensitive     CEFTRIAXONE <=1 SENSITIVE Sensitive     CIPROFLOXACIN <=0.25 SENSITIVE Sensitive     GENTAMICIN <=1 SENSITIVE Sensitive     IMIPENEM <=0.25 SENSITIVE Sensitive     NITROFURANTOIN <=16 SENSITIVE Sensitive     TRIMETH/SULFA <=20 SENSITIVE Sensitive     AMPICILLIN/SULBACTAM 4 SENSITIVE Sensitive     PIP/TAZO <=4 SENSITIVE Sensitive     Extended ESBL NEGATIVE Sensitive     * >=100,000 COLONIES/mL ESCHERICHIA COLI    RADIOLOGY:  No results found.   Management plans discussed with the patient, family and they are in agreement.  CODE STATUS: Full Code   TOTAL TIME TAKING CARE OF THIS PATIENT: 40 minutes.    Deborah Case M.D on 09/13/2018 at 10:18 AM  Between 7am to 6pm - Pager - [phone]  After 6pm go to [email or web address] - Proofreader  [facility]  Office  [phone]  CC: Primary care physician; Hortencia Pilar, MD   Note: This dictation was prepared with Dragon dictation along with smaller phrase technology. Any transcriptional errors that result from this process are unintentional.

## 2018-09-13 NOTE — [place] Notes (Signed)
Patient discharged back to [facility]. Transported via EMS

## 2018-09-13 NOTE — NC FL2 (Addendum)
[place] LEVEL OF CARE SCREENING TOOL     IDENTIFICATION  Patient Name: Deborah Case Birthdate: 01-Aug-1929 Sex: female Admission Date (Current Location): 09/11/2018  [place] and [place] Number:  Engineering geologist and Address:  [facility], [address], [place], [place] [address]      Provider Number: [id number]  Attending Physician Name and Address:  Mayo, Pete Pelt, MD  Relative Name and Phone Number:       Current Level of Care: Hospital Recommended Level of Care: [place], Memory Care Prior Approval Number:    Date Approved/Denied:   PASRR Number:    Discharge Plan: (ALF Memory Care)    Current Diagnoses: Dementia Patient Active Problem List   Diagnosis Date Noted  . AKI (acute kidney injury) ([place]) 09/05/2018    Orientation RESPIRATION BLADDER Height & Weight     Self, Place  Normal Continent Weight: 149 lb 4 oz (67.7 kg) Height:  5\' 2"  (157.5 cm)  BEHAVIORAL SYMPTOMS/MOOD NEUROLOGICAL BOWEL NUTRITION STATUS  (none) (none) Continent Diet(low na; heart healthy)  AMBULATORY STATUS COMMUNICATION OF NEEDS Skin   Limited Assist Verbally Normal                       Personal Care Assistance Level of Assistance  Dressing, Feeding, Bathing Bathing Assistance: Limited assistance Feeding assistance: Limited assistance Dressing Assistance: Limited assistance     Functional Limitations Info  (no issues documented)          SPECIAL CARE FACTORS FREQUENCY  [facility] to resume previous orders                    Contractures Contractures Info: Not present    Additional Factors Info  Code Status Code Status Info: full               Medication List        STOP taking these medications       lisinopril 10 MG tablet Commonly known as:  PRINIVIL,ZESTRIL             TAKE these medications       acetaminophen 500 MG tablet Commonly known as:  TYLENOL Take 500 mg by  mouth every 4 (four) hours as needed for mild pain or moderate pain.   amLODipine 2.5 MG tablet Commonly known as:  NORVASC Take 1 tablet (2.5 mg total) by mouth daily.   atorvastatin 80 MG tablet Commonly known as:  LIPITOR Take 80 mg by mouth at bedtime.   carvedilol 6.25 MG tablet Commonly known as:  COREG Take 6.25 mg by mouth daily.   cephALEXin 500 MG capsule Commonly known as:  KEFLEX Take 1 capsule (500 mg total) by mouth 2 (two) times daily. For 4 more days   clopidogrel 75 MG tablet Commonly known as:  PLAVIX Take 75 mg by mouth daily.   guaifenesin 100 MG/5ML syrup Commonly known as:  ROBITUSSIN Take 200 mg by mouth every 6 (six) hours as needed for cough.   levothyroxine 88 MCG tablet Commonly known as:  SYNTHROID, LEVOTHROID Take 88 mcg by mouth daily before breakfast.   loperamide 2 MG capsule Commonly known as:  IMODIUM Take 2 mg by mouth as needed for diarrhea or loose stools. (max 8 doses in 24 hours)   magnesium hydroxide 400 MG/5ML suspension Commonly known as:  MILK OF MAGNESIA Take 30 mLs by mouth daily as needed for mild constipation.  Mintox 017-793-90 MG/5ML suspension Generic drug:  alum & mag hydroxide-simeth Take 30 mLs by mouth every 6 (six) hours as needed for indigestion or heartburn.   neomycin-bacitracin-polymyxin ointment Commonly known as:  NEOSPORIN Apply 1 application topically every 12 (twelve) hours.   QUEtiapine 25 MG tablet Commonly known as:  SEROQUEL Take 1-2 tablets (25-50 mg total) by mouth 2 (two) times daily. Take one tablet (25 mg) by mouth in the morning and two tablets (50 mg) at bedtime       Additional Information    Shela Leff, LCSW

## 2018-10-17 ENCOUNTER — Other Ambulatory Visit: Payer: Self-pay

## 2018-10-17 ENCOUNTER — Emergency Department: Payer: Medicare Other

## 2018-10-17 ENCOUNTER — Emergency Department
Admission: EM | Admit: 2018-10-17 | Discharge: 2018-10-17 | Disposition: A | Discharge status: Skilled Nursing Facility | Payer: Medicare Other | Attending: Emergency Medicine | Admitting: Emergency Medicine

## 2018-10-17 DIAGNOSIS — Z79899 Other long term (current) drug therapy: Secondary | ICD-10-CM | POA: Insufficient documentation

## 2018-10-17 DIAGNOSIS — M25511 Pain in right shoulder: Secondary | ICD-10-CM | POA: Insufficient documentation

## 2018-10-17 DIAGNOSIS — E119 Type 2 diabetes mellitus without complications: Secondary | ICD-10-CM | POA: Diagnosis not present

## 2018-10-17 DIAGNOSIS — M25561 Pain in right knee: Secondary | ICD-10-CM | POA: Insufficient documentation

## 2018-10-17 DIAGNOSIS — R51 Headache: Secondary | ICD-10-CM | POA: Diagnosis not present

## 2018-10-17 DIAGNOSIS — Z8673 Personal history of transient ischemic attack (TIA), and cerebral infarction without residual deficits: Secondary | ICD-10-CM | POA: Diagnosis not present

## 2018-10-17 DIAGNOSIS — Z8542 Personal history of malignant neoplasm of other parts of uterus: Secondary | ICD-10-CM | POA: Diagnosis not present

## 2018-10-17 DIAGNOSIS — I509 Heart failure, unspecified: Secondary | ICD-10-CM | POA: Diagnosis not present

## 2018-10-17 DIAGNOSIS — Z7901 Long term (current) use of anticoagulants: Secondary | ICD-10-CM | POA: Diagnosis not present

## 2018-10-17 DIAGNOSIS — F0390 Unspecified dementia without behavioral disturbance: Secondary | ICD-10-CM | POA: Insufficient documentation

## 2018-10-17 DIAGNOSIS — I110 Hypertensive heart disease with heart failure: Secondary | ICD-10-CM | POA: Diagnosis not present

## 2018-10-17 DIAGNOSIS — W19XXXA Unspecified fall, initial encounter: Secondary | ICD-10-CM

## 2018-10-17 HISTORY — DX: Transient cerebral ischemic attack, unspecified: G45.9

## 2018-10-17 LAB — BASIC METABOLIC PANEL
Anion gap: 6 (ref 5–15)
BUN: 33 mg/dL — ABNORMAL HIGH (ref 8–23)
CO2: 27 mmol/L (ref 22–32)
Calcium: 9.3 mg/dL (ref 8.9–10.3)
Chloride: 108 mmol/L (ref 98–111)
Creatinine, Ser: 0.89 mg/dL (ref 0.44–1.00)
GFR calc Af Amer: >60 mL/min (ref >60)
GFR calc non Af Amer: 58 mL/min — ABNORMAL LOW (ref >60)
Glucose, Bld: 89 mg/dL (ref 70–99)
Potassium: 4.7 mmol/L (ref 3.5–5.1)
Sodium: 141 mmol/L (ref 135–145)

## 2018-10-17 LAB — CBC
HCT: 30.4 % — ABNORMAL LOW (ref 36.0–46.0)
Hemoglobin: 9.6 g/dL — ABNORMAL LOW (ref 12.0–15.0)
MCH: 29.8 pg (ref 26.0–34.0)
MCHC: 31.6 g/dL (ref 30.0–36.0)
MCV: 94.4 fL (ref 80.0–100.0)
Platelets: 105 10*3/uL — ABNORMAL LOW (ref 150–400)
RBC: 3.22 MIL/uL — ABNORMAL LOW (ref 3.87–5.11)
RDW: 15.9 % — ABNORMAL HIGH (ref 11.5–15.5)
WBC: 4.5 10*3/uL (ref 4.0–10.5)
nRBC: 0 % (ref 0.0–0.2)

## 2018-10-17 MED ORDER — ACETAMINOPHEN 500 MG PO TABS
1000.0000 mg | ORAL_TABLET | Freq: Once | ORAL | Status: AC
  Administered 2018-10-17: 1000 mg via ORAL
  Filled 2018-10-17: qty 2

## 2018-10-17 NOTE — ED Notes (Signed)
PT placed on bed alarm, window open so that pt is visible, call bell in reach, bed rails up. PT has tennis shoes on and they are tied.

## 2018-10-17 NOTE — ED Notes (Signed)
PT given snacks and juice and sat up in bed

## 2018-10-17 NOTE — ED Notes (Signed)
PT lying with eyes closed. Respirations even and unlabored.

## 2018-10-17 NOTE — ED Notes (Signed)
Attempted to contact legal guardian, Deborah Case

## 2018-10-17 NOTE — ED Notes (Signed)
[facility]  CALLED  FOR  TRANSPORT 

## 2018-10-17 NOTE — ED Triage Notes (Signed)
PT to ED via [place] from [facility]. PT had unwitnessed fall. PT c/o right shoulder pain, headache and dizziness. Pt has hematoma to right/posterior head. PT alert at this time.

## 2018-10-17 NOTE — ED Notes (Signed)
PT resting. VSS.

## 2018-10-17 NOTE — ED Provider Notes (Signed)
[facility] Emergency Department Provider Note  Time seen: 11:40 AM  I have reviewed the triage vital signs and the nursing notes.   HISTORY  Chief Complaint Fall   HPI Deborah Case is a 85 y.o. female with a past medical history of CHF, diabetes, hypertension, hyperlipidemia, dementia, presents to the emergency department for a fall.  According to EMS report patient lives at [facility] Case, history of dementia, had an unwitnessed fall today.  Is complaining of right shoulder pain and left knee pain.  No known LOC.  Patient does not recall the events of the fall.  Cannot provide an adequate history or review of systems personally.   Past Medical History:  Diagnosis Date  . Cancer (Deborah Case)    uterine  . CHF (congestive heart failure) (Deborah Case)   . Dementia (Deborah Case)   . Diabetes mellitus without complication (Deborah Case)   . Hypercholesteremia   . Hypertension   . Pulmonary embolism (Deborah Case)   . Thyroid disease   . TIA (transient ischemic attack)     Patient Active Problem List   Diagnosis Date Noted  . AKI (acute kidney injury) (Deborah Case) 09/05/2018    Past Surgical History:  Procedure Laterality Date  . ABDOMINAL HYSTERECTOMY      Prior to Admission medications   Medication Sig Start Date End Date Taking? Authorizing Provider  acetaminophen (TYLENOL) 500 MG tablet Take 500 mg by mouth every 4 (four) hours as needed for mild pain or moderate pain.    [provider]  alum & mag hydroxide-simeth (Deborah Case) 200-200-20 MG/5ML suspension Take 30 mLs by mouth every 6 (six) hours as needed for indigestion or heartburn.    [provider]  amLODipine (NORVASC) 2.5 MG tablet Take 1 tablet (2.5 mg total) by mouth daily. 09/13/18   Deborah Case, Deborah Pelt, MD  atorvastatin (LIPITOR) 80 MG tablet Take 80 mg by mouth at bedtime.    [provider]  carvedilol (COREG) 6.25 MG tablet Take 6.25 mg by mouth daily.     [provider]  cephALEXin (KEFLEX) 500 MG  capsule Take 1 capsule (500 mg total) by mouth 2 (two) times daily. For 4 more days 09/13/18   Deborah Case, Deborah Pelt, MD  clopidogrel (PLAVIX) 75 MG tablet Take 75 mg by mouth daily.    [provider]  guaifenesin (ROBITUSSIN) 100 MG/5ML syrup Take 200 mg by mouth every 6 (six) hours as needed for cough.    [provider]  levothyroxine (SYNTHROID, LEVOTHROID) 88 MCG tablet Take 88 mcg by mouth daily before breakfast.    [provider]  loperamide (IMODIUM) 2 MG capsule Take 2 mg by mouth as needed for diarrhea or loose stools. (max 8 doses in 24 hours)    [provider]  magnesium hydroxide (MILK OF MAGNESIA) 400 MG/5ML suspension Take 30 mLs by mouth daily as needed for mild constipation.    [provider]  neomycin-bacitracin-polymyxin (NEOSPORIN) ointment Apply 1 application topically every 12 (twelve) hours.    [provider]  QUEtiapine (SEROQUEL) 25 MG tablet Take 1-2 tablets (25-50 mg total) by mouth 2 (two) times daily. Take one tablet (25 mg) by mouth in the morning and two tablets (50 mg) at bedtime 09/09/18   Salary, Avel Peace, MD    Allergies  Allergen Reactions  . Aspirin Other (See Comments)    On plavix.  . Donepezil Other (See Comments)    Elevated bp  . Metformin Other (See Comments)  . Metformin  And Related     No family history on file.  Social History Social History   Tobacco Use  . Smoking status: Never Smoker  . Smokeless tobacco: Never Used  Substance Use Topics  . Alcohol use: No  . Drug use: No    Review of Systems Unable to obtain adequate/accurate review of systems secondary to baseline dementia.  ____________________________________________   PHYSICAL EXAM:  Constitutional: Awake and alert, no acute distress.  Baseline dementia. Eyes: Normal exam ENT      Head: Normocephalic and atraumatic.      Mouth/Throat: Mucous membranes are moist. Cardiovascular: Normal rate, regular  rhythm. Respiratory: Normal respiratory effort without tachypnea nor retractions. Breath sounds are clear  Gastrointestinal: Soft and nontender. No distention.   Musculoskeletal: Patient has moderate tenderness to the right shoulder, with deformity.  Neurovascular intact distally.  Good range of motion in elbow and wrist.  Patient also has mild pain in the left knee, good range of motion in the left knee. Neurologic:  Normal speech and language. No gross focal neurologic deficits  Skin:  Skin is warm, dry  Psychiatric: Mood and affect are normal.   ____________________________________________    EKG  EKG viewed and interpreted by myself shows a normal sinus rhythm at 52 bpm with a narrow QRS, normal axis, normal intervals, no concerning ST changes.  ____________________________________________    RADIOLOGY  X-rays are negative for acute abnormality. CT scan negative for acute abnormality.  ____________________________________________   INITIAL IMPRESSION / ASSESSMENT AND PLAN / ED COURSE  Pertinent labs & imaging results that were available during my care of the patient were reviewed by me and considered in my medical decision making (see chart for details).   Patient presents to the emergency department for a fall with right shoulder pain, left knee pain.  We will check x-rays of the right shoulder, left knee.  We will obtain a CT scan of the head as a precaution as well as basic lab work given her unwitnessed fall.  Overall patient appears well, no distress.  Patient's work-up is essentially negative.  Lab work is reassuring.  EKG reassuring.  CT scan is negative, x-rays are negative.  Suspect likely contusion.  We will have the patient follow-up with her doctor for recheck/reevaluation in 1 week.  Lab work appears to be baseline for the patient.  [name] was evaluated in Emergency Department on 10/17/2018 for the symptoms described in the history of present illness. She was  evaluated in the context of the global COVID-19 pandemic, which necessitated consideration that the patient might be at risk for infection with the SARS-CoV-2 virus that causes COVID-19. Institutional protocols and algorithms that pertain to the evaluation of patients at risk for COVID-19 are in a state of rapid change based on information released by regulatory bodies including the CDC and federal and state organizations. These policies and algorithms were followed during the patient's care in the ED.  ____________________________________________   FINAL CLINICAL IMPRESSION(S) / ED DIAGNOSES  Cheral Marker, MD 10/17/18 1318

## 2018-10-17 NOTE — ED Notes (Signed)
Spoke with Trey Paula, legal guardian. Updated to pt arrival and pending d/c.

## 2018-10-31 ENCOUNTER — Other Ambulatory Visit: Payer: Self-pay

## 2018-10-31 ENCOUNTER — Emergency Department: Payer: Medicare Other

## 2018-10-31 ENCOUNTER — Inpatient Hospital Stay
Admission: EM | Admit: 2018-10-31 | Discharge: 2018-11-30 | DRG: 640 | Disposition: E | Payer: Medicare Other | Source: Skilled Nursing Facility | Attending: Internal Medicine | Admitting: Internal Medicine

## 2018-10-31 DIAGNOSIS — I9589 Other hypotension: Secondary | ICD-10-CM | POA: Diagnosis present

## 2018-10-31 DIAGNOSIS — E861 Hypovolemia: Secondary | ICD-10-CM | POA: Diagnosis present

## 2018-10-31 DIAGNOSIS — F0390 Unspecified dementia without behavioral disturbance: Secondary | ICD-10-CM | POA: Diagnosis present

## 2018-10-31 DIAGNOSIS — R4182 Altered mental status, unspecified: Secondary | ICD-10-CM | POA: Diagnosis not present

## 2018-10-31 DIAGNOSIS — E785 Hyperlipidemia, unspecified: Secondary | ICD-10-CM | POA: Diagnosis present

## 2018-10-31 DIAGNOSIS — Z7902 Long term (current) use of antithrombotics/antiplatelets: Secondary | ICD-10-CM

## 2018-10-31 DIAGNOSIS — Z66 Do not resuscitate: Secondary | ICD-10-CM | POA: Diagnosis present

## 2018-10-31 DIAGNOSIS — E119 Type 2 diabetes mellitus without complications: Secondary | ICD-10-CM | POA: Diagnosis present

## 2018-10-31 DIAGNOSIS — Z7189 Other specified counseling: Secondary | ICD-10-CM | POA: Diagnosis not present

## 2018-10-31 DIAGNOSIS — I5032 Chronic diastolic (congestive) heart failure: Secondary | ICD-10-CM | POA: Diagnosis present

## 2018-10-31 DIAGNOSIS — N179 Acute kidney failure, unspecified: Secondary | ICD-10-CM | POA: Diagnosis present

## 2018-10-31 DIAGNOSIS — R402433 Glasgow coma scale score 3-8, at hospital admission: Secondary | ICD-10-CM | POA: Diagnosis present

## 2018-10-31 DIAGNOSIS — D696 Thrombocytopenia, unspecified: Secondary | ICD-10-CM | POA: Diagnosis present

## 2018-10-31 DIAGNOSIS — I255 Ischemic cardiomyopathy: Secondary | ICD-10-CM | POA: Diagnosis present

## 2018-10-31 DIAGNOSIS — R001 Bradycardia, unspecified: Secondary | ICD-10-CM | POA: Diagnosis present

## 2018-10-31 DIAGNOSIS — R570 Cardiogenic shock: Secondary | ICD-10-CM | POA: Diagnosis present

## 2018-10-31 DIAGNOSIS — G9341 Metabolic encephalopathy: Secondary | ICD-10-CM | POA: Diagnosis present

## 2018-10-31 DIAGNOSIS — Z8673 Personal history of transient ischemic attack (TIA), and cerebral infarction without residual deficits: Secondary | ICD-10-CM | POA: Diagnosis not present

## 2018-10-31 DIAGNOSIS — I110 Hypertensive heart disease with heart failure: Secondary | ICD-10-CM | POA: Diagnosis present

## 2018-10-31 DIAGNOSIS — Z515 Encounter for palliative care: Secondary | ICD-10-CM

## 2018-10-31 DIAGNOSIS — Z86711 Personal history of pulmonary embolism: Secondary | ICD-10-CM | POA: Diagnosis not present

## 2018-10-31 DIAGNOSIS — R4701 Aphasia: Secondary | ICD-10-CM | POA: Diagnosis present

## 2018-10-31 DIAGNOSIS — J9601 Acute respiratory failure with hypoxia: Secondary | ICD-10-CM

## 2018-10-31 DIAGNOSIS — Z79890 Hormone replacement therapy (postmenopausal): Secondary | ICD-10-CM

## 2018-10-31 DIAGNOSIS — J9602 Acute respiratory failure with hypercapnia: Secondary | ICD-10-CM | POA: Diagnosis present

## 2018-10-31 DIAGNOSIS — Z8542 Personal history of malignant neoplasm of other parts of uterus: Secondary | ICD-10-CM | POA: Diagnosis not present

## 2018-10-31 DIAGNOSIS — E039 Hypothyroidism, unspecified: Secondary | ICD-10-CM | POA: Diagnosis present

## 2018-10-31 DIAGNOSIS — Z20828 Contact with and (suspected) exposure to other viral communicable diseases: Secondary | ICD-10-CM | POA: Diagnosis present

## 2018-10-31 DIAGNOSIS — R34 Anuria and oliguria: Secondary | ICD-10-CM | POA: Diagnosis present

## 2018-10-31 DIAGNOSIS — E875 Hyperkalemia: Secondary | ICD-10-CM | POA: Diagnosis present

## 2018-10-31 DIAGNOSIS — D649 Anemia, unspecified: Secondary | ICD-10-CM | POA: Diagnosis present

## 2018-10-31 LAB — DIFFERENTIAL
Abs Immature Granulocytes: 0.03 10*3/uL (ref 0.00–0.07)
Basophils Absolute: 0 10*3/uL (ref 0.0–0.1)
Basophils Relative: 0 %
Eosinophils Absolute: 0 10*3/uL (ref 0.0–0.5)
Eosinophils Relative: 0 %
Immature Granulocytes: 1 %
Lymphocytes Relative: 13 %
Lymphs Abs: 0.8 10*3/uL (ref 0.7–4.0)
Monocytes Absolute: 0.3 10*3/uL (ref 0.1–1.0)
Monocytes Relative: 5 %
Neutro Abs: 5.3 10*3/uL (ref 1.7–7.7)
Neutrophils Relative %: 81 %
Smear Review: DECREASED

## 2018-10-31 LAB — COMPREHENSIVE METABOLIC PANEL
ALT: 103 U/L — ABNORMAL HIGH (ref 0–44)
AST: 96 U/L — ABNORMAL HIGH (ref 15–41)
Albumin: 3.7 g/dL (ref 3.5–5.0)
Alkaline Phosphatase: 122 U/L (ref 38–126)
Anion gap: 9 (ref 5–15)
BUN: 85 mg/dL — ABNORMAL HIGH (ref 8–23)
CO2: 15 mmol/L — ABNORMAL LOW (ref 22–32)
Calcium: 8.9 mg/dL (ref 8.9–10.3)
Chloride: 109 mmol/L (ref 98–111)
Creatinine, Ser: 1.72 mg/dL — ABNORMAL HIGH (ref 0.44–1.00)
GFR calc Af Amer: 30 mL/min — ABNORMAL LOW (ref >60)
GFR calc non Af Amer: 26 mL/min — ABNORMAL LOW (ref >60)
Glucose, Bld: 126 mg/dL — ABNORMAL HIGH (ref 70–99)
Potassium: >7.5 mmol/L (ref 3.5–5.1)
Sodium: 133 mmol/L — ABNORMAL LOW (ref 135–145)
Total Bilirubin: 0.4 mg/dL (ref 0.3–1.2)
Total Protein: 5.9 g/dL — ABNORMAL LOW (ref 6.5–8.1)

## 2018-10-31 LAB — BASIC METABOLIC PANEL
Anion gap: 5 (ref 5–15)
Anion gap: 5 (ref 5–15)
BUN: 81 mg/dL — ABNORMAL HIGH (ref 8–23)
BUN: 85 mg/dL — ABNORMAL HIGH (ref 8–23)
CO2: 21 mmol/L — ABNORMAL LOW (ref 22–32)
CO2: 20 mmol/L — ABNORMAL LOW (ref 22–32)
Calcium: 8.9 mg/dL (ref 8.9–10.3)
Calcium: 8.9 mg/dL (ref 8.9–10.3)
Chloride: 112 mmol/L — ABNORMAL HIGH (ref 98–111)
Chloride: 111 mmol/L (ref 98–111)
Creatinine, Ser: 1.6 mg/dL — ABNORMAL HIGH (ref 0.44–1.00)
Creatinine, Ser: 1.52 mg/dL — ABNORMAL HIGH (ref 0.44–1.00)
GFR calc Af Amer: 33 mL/min — ABNORMAL LOW (ref >60)
GFR calc Af Amer: 35 mL/min — ABNORMAL LOW (ref >60)
GFR calc non Af Amer: 29 mL/min — ABNORMAL LOW (ref >60)
GFR calc non Af Amer: 31 mL/min — ABNORMAL LOW (ref >60)
Glucose, Bld: 114 mg/dL — ABNORMAL HIGH (ref 70–99)
Glucose, Bld: 149 mg/dL — ABNORMAL HIGH (ref 70–99)
Potassium: 6.2 mmol/L — ABNORMAL HIGH (ref 3.5–5.1)
Potassium: 6.5 mmol/L (ref 3.5–5.1)
Sodium: 138 mmol/L (ref 135–145)
Sodium: 136 mmol/L (ref 135–145)

## 2018-10-31 LAB — CBC
HCT: 26.6 % — ABNORMAL LOW (ref 36.0–46.0)
Hemoglobin: 8.1 g/dL — ABNORMAL LOW (ref 12.0–15.0)
MCH: 30 pg (ref 26.0–34.0)
MCHC: 30.5 g/dL (ref 30.0–36.0)
MCV: 98.5 fL (ref 80.0–100.0)
Platelets: 51 10*3/uL — ABNORMAL LOW (ref 150–400)
RBC: 2.7 MIL/uL — ABNORMAL LOW (ref 3.87–5.11)
RDW: 16.4 % — ABNORMAL HIGH (ref 11.5–15.5)
WBC: 6.5 10*3/uL (ref 4.0–10.5)
nRBC: 0 % (ref 0.0–0.2)

## 2018-10-31 LAB — URINALYSIS, COMPLETE (UACMP) WITH MICROSCOPIC
Bacteria, UA: NONE SEEN
Bilirubin Urine: NEGATIVE
Glucose, UA: NEGATIVE mg/dL
Hgb urine dipstick: NEGATIVE
Ketones, ur: NEGATIVE mg/dL
Leukocytes,Ua: NEGATIVE
Nitrite: NEGATIVE
Protein, ur: NEGATIVE mg/dL
Specific Gravity, Urine: 1.016 (ref 1.005–1.030)
pH: 5 (ref 5.0–8.0)

## 2018-10-31 LAB — GLUCOSE, CAPILLARY: Glucose-Capillary: 140 mg/dL — ABNORMAL HIGH (ref 70–99)

## 2018-10-31 LAB — ETHANOL: Alcohol, Ethyl (B): <10 mg/dL (ref <10)

## 2018-10-31 LAB — APTT: aPTT: 42 seconds — ABNORMAL HIGH (ref 24–36)

## 2018-10-31 LAB — PROTIME-INR
INR: 1.1 (ref 0.8–1.2)
Prothrombin Time: 13.6 seconds (ref 11.4–15.2)

## 2018-10-31 LAB — SARS CORONAVIRUS 2 BY RT PCR (HOSPITAL ORDER, PERFORMED IN ~~LOC~~ HOSPITAL LAB): SARS Coronavirus 2: NEGATIVE

## 2018-10-31 MED ORDER — ATROPINE SULFATE 1 MG/10ML IJ SOSY
PREFILLED_SYRINGE | INTRAMUSCULAR | Status: AC
  Administered 2018-10-31: 14:00:00 0.5 mg via INTRAVENOUS
  Filled 2018-10-31: qty 10

## 2018-10-31 MED ORDER — DOPAMINE-DEXTROSE 3.2-5 MG/ML-% IV SOLN
0.0000 ug/kg/min | INTRAVENOUS | Status: DC
  Administered 2018-10-31: 5 ug/kg/min via INTRAVENOUS
  Administered 2018-11-01 (×2): 20 ug/kg/min via INTRAVENOUS
  Filled 2018-10-31 (×2): qty 250

## 2018-10-31 MED ORDER — MORPHINE SULFATE (PF) 2 MG/ML IV SOLN
1.0000 mg | Freq: Once | INTRAVENOUS | Status: AC
  Administered 2018-10-31: 21:00:00 1 mg via INTRAVENOUS

## 2018-10-31 MED ORDER — INSULIN ASPART 100 UNIT/ML ~~LOC~~ SOLN
0.0000 [IU] | Freq: Every day | SUBCUTANEOUS | Status: DC
  Administered 2018-10-31: 23:00:00 1 [IU] via SUBCUTANEOUS
  Filled 2018-10-31: qty 1

## 2018-10-31 MED ORDER — ATROPINE SULFATE 1 MG/10ML IJ SOSY
0.5000 mg | PREFILLED_SYRINGE | Freq: Once | INTRAMUSCULAR | Status: AC
  Administered 2018-10-31: 14:00:00 0.5 mg via INTRAVENOUS
  Filled 2018-10-31: qty 10

## 2018-10-31 MED ORDER — INSULIN ASPART 100 UNIT/ML ~~LOC~~ SOLN
10.0000 [IU] | Freq: Once | SUBCUTANEOUS | Status: AC
  Administered 2018-10-31: 14:00:00 10 [IU] via SUBCUTANEOUS
  Filled 2018-10-31: qty 1

## 2018-10-31 MED ORDER — SODIUM BICARBONATE 8.4 % IV SOLN
50.0000 meq | Freq: Once | INTRAVENOUS | Status: AC
  Administered 2018-10-31: 50 meq via INTRAVENOUS
  Filled 2018-10-31: qty 50

## 2018-10-31 MED ORDER — SODIUM CHLORIDE 0.9 % IV SOLN
1000.0000 mL | Freq: Once | INTRAVENOUS | Status: AC
  Administered 2018-10-31: 16:00:00 1000 mL via INTRAVENOUS

## 2018-10-31 MED ORDER — CALCIUM GLUCONATE-NACL 1-0.675 GM/50ML-% IV SOLN
1.0000 g | Freq: Once | INTRAVENOUS | Status: AC
  Administered 2018-10-31: 16:00:00 1000 mg via INTRAVENOUS
  Filled 2018-10-31: qty 50

## 2018-10-31 MED ORDER — ATROPINE SULFATE 1 MG/10ML IJ SOSY: 1.0000 mg | PREFILLED_SYRINGE | INTRAMUSCULAR | Status: DC | PRN

## 2018-10-31 MED ORDER — MORPHINE SULFATE (PF) 2 MG/ML IV SOLN
INTRAVENOUS | Status: AC
  Filled 2018-10-31: qty 1

## 2018-10-31 MED ORDER — SODIUM CHLORIDE 0.9 % IV SOLN
Freq: Once | INTRAVENOUS | Status: AC
  Administered 2018-10-31: 17:00:00 via INTRAVENOUS

## 2018-10-31 MED ORDER — HYDRALAZINE HCL 20 MG/ML IJ SOLN: 10.0000 mg | INTRAMUSCULAR | Status: DC | PRN

## 2018-10-31 MED ORDER — DEXTROSE 50 % IV SOLN
25.0000 mL | Freq: Once | INTRAVENOUS | Status: AC
  Administered 2018-10-31: 14:00:00 25 mL via INTRAVENOUS
  Filled 2018-10-31: qty 50

## 2018-10-31 MED ORDER — INSULIN ASPART 100 UNIT/ML ~~LOC~~ SOLN: 0.0000 [IU] | Freq: Three times a day (TID) | SUBCUTANEOUS | Status: DC

## 2018-10-31 MED ORDER — MORPHINE SULFATE (PF) 2 MG/ML IV SOLN
2.0000 mg | Freq: Once | INTRAVENOUS | Status: AC
  Administered 2018-10-31: 23:00:00 2 mg via INTRAVENOUS
  Filled 2018-10-31: qty 1

## 2018-10-31 MED ORDER — SODIUM CHLORIDE 0.9 % IV SOLN
1.0000 g | Freq: Once | INTRAVENOUS | Status: DC
  Administered 2018-10-31: 16:00:00 1 g via INTRAVENOUS
  Filled 2018-10-31: qty 10

## 2018-10-31 MED ORDER — FUROSEMIDE 10 MG/ML IJ SOLN: 40.0000 mg | INTRAMUSCULAR | Status: DC

## 2018-10-31 MED ORDER — CALCIUM GLUCONATE 10 % IV SOLN
INTRAVENOUS | Status: AC
  Administered 2018-10-31: 15:00:00
  Filled 2018-10-31: qty 10

## 2018-10-31 MED ORDER — CALCIUM GLUCONATE 10 % IV SOLN
1.0000 g | Freq: Once | INTRAVENOUS | Status: AC
  Administered 2018-10-31: 14:00:00 1 g via INTRAVENOUS
  Filled 2018-10-31: qty 10

## 2018-10-31 NOTE — ED Notes (Signed)
Family at bedside, admitting physician paged to come speak with family.

## 2018-10-31 NOTE — ED Notes (Signed)
awaiting bed status. Vss. Dr. Stark Jock to speak with presenting family members.

## 2018-10-31 NOTE — Consult Note (Signed)
[address] [place], [place] [address] Phone [phone]. FAx [phone]  Date: 11/29/2018                  Patient Name:  Deborah Case  MRN: [id number]  DOB: November 18, 1929  Age / Sex: 85 y.o., female         PCP: Hortencia Pilar, MD                 Service Requesting Consult: ER/ Lavonia Drafts, MD                 Reason for Consult: Hyperkalemia, ARF            History of Present Illness: Patient is a 85 y.o. female with multiple chronic medical problems as listed below, who was admitted to [facility] on 11/08/2018 for evaluation of hyperkalemia.  Patient was brought over from [facility] by caretaker for unable to speak today. Patient is very lethargic and not able to provide any meaningful information Upon evaluation in the ER, she was found to be hypotensive, bradycardic.  Her electrolyte panel showed a potassium level greater than 7.5 Patient was given fluid bolus in the ER for a total of 1.5 to 2 L since patient was hypotensive and appeared volume depleted.  This led to some shortness of breath and decreased oxygen saturations therefore she was then placed on BiPAP with improved saturation.  Heart rate remains critically low to mid 30s.  Patient was also placed on dopamine. Bedside bladder scan showed urine volume of 60 cc Nephrology consult was requested for evaluation  Medications: Outpatient medications: (Not in a hospital admission)   Current medications: Current Facility-Administered Medications  Medication Dose Route Frequency Provider Last Rate Last Dose  . 0.9 %  sodium chloride infusion  1,000 mL Intravenous Once Lavonia Drafts, MD      . calcium gluconate 1 g/ 50 mL sodium chloride IVPB  1 g Intravenous Once Lavonia Drafts, MD      . DOPamine (INTROPIN) 800 mg in dextrose 5 % 250 mL (3.2 mg/mL) infusion  0-20 mcg/kg/min Intravenous Continuous Lavonia Drafts, MD 5.95 mL/hr at 11/21/2018 1521 5 mcg/kg/min at 11/08/2018 1521   Current Outpatient  Medications  Medication Sig Dispense Refill  . acetaminophen (TYLENOL) 500 MG tablet Take 500 mg by mouth every 4 (four) hours as needed for mild pain or moderate pain.    Marland Kitchen acetaminophen (TYLENOL) 500 MG tablet Take 500 mg by mouth 2 (two) times a day. (in the morning and afternoon)    . alum & mag hydroxide-simeth (MINTOX) [phone] MG/5ML suspension Take 30 mLs by mouth every 6 (six) hours as needed for indigestion or heartburn.    Marland Kitchen amLODipine (NORVASC) 2.5 MG tablet Take 1 tablet (2.5 mg total) by mouth daily. (Patient not taking: Reported on 10/17/2018) 30 tablet 0  . atorvastatin (LIPITOR) 80 MG tablet Take 80 mg by mouth at bedtime.    . carvedilol (COREG) 6.25 MG tablet Take 6.25 mg by mouth daily.     . cephALEXin (KEFLEX) 500 MG capsule Take 1 capsule (500 mg total) by mouth 2 (two) times daily. For 4 more days (Patient not taking: Reported on 10/17/2018) 9 capsule 0  . clopidogrel (PLAVIX) 75 MG tablet Take 75 mg by mouth daily.    Marland Kitchen guaifenesin (ROBITUSSIN) 100 MG/5ML syrup Take 200 mg by mouth every 6 (six) hours as needed for cough.    . levothyroxine (SYNTHROID) 112 MCG tablet Take 112 mcg  by mouth daily before breakfast.    . lisinopril (ZESTRIL) 10 MG tablet Take 10 mg by mouth daily.    Marland Kitchen loperamide (IMODIUM) 2 MG capsule Take 2 mg by mouth as needed for diarrhea or loose stools.     . magnesium hydroxide (MILK OF MAGNESIA) 400 MG/5ML suspension Take 30 mLs by mouth daily as needed for mild constipation.    Marland Kitchen neomycin-bacitracin-polymyxin (NEOSPORIN) ointment Apply 1 application topically as needed for wound care.     Marland Kitchen QUEtiapine (SEROQUEL) 25 MG tablet Take 1-2 tablets (25-50 mg total) by mouth 2 (two) times daily. Take one tablet (25 mg) by mouth in the morning and two tablets (50 mg) at bedtime (Patient taking differently: Take 25-50 mg by mouth See admin instructions. 25 mg every morning and 50 mg at bedtime) 60 tablet 0      Allergies: No Known Allergies    Past  Medical History: Past Medical History:  Diagnosis Date  . Cancer ([place])    uterine  . CHF (congestive heart failure) ([place])   . Dementia ([place])   . Diabetes mellitus without complication ([place])   . Hypercholesteremia   . Hypertension   . Pulmonary embolism ([place])   . Thyroid disease   . TIA (transient ischemic attack)      Past Surgical History: Past Surgical History:  Procedure Laterality Date  . ABDOMINAL HYSTERECTOMY       Family History: History reviewed. No pertinent family history.   Social History: Social History   Socioeconomic History  . Marital status: Widowed    Spouse name: Not on file  . Number of children: Not on file  . Years of education: Not on file  . Highest education level: Not on file  Occupational History  . Not on file  Social Needs  . Financial resource strain: Not on file  . Food insecurity:    Worry: Not on file    Inability: Not on file  . Transportation needs:    Medical: Not on file    Non-medical: Not on file  Tobacco Use  . Smoking status: Never Smoker  . Smokeless tobacco: Never Used  Substance and Sexual Activity  . Alcohol use: No  . Drug use: No  . Sexual activity: Never  Lifestyle  . Physical activity:    Days per week: Not on file    Minutes per session: Not on file  . Stress: Not on file  Relationships  . Social connections:    Talks on phone: Not on file    Gets together: Not on file    Attends religious service: Not on file    Active member of club or organization: Not on file    Attends meetings of clubs or organizations: Not on file    Relationship status: Not on file  . Intimate partner violence:    Fear of current or ex partner: Not on file    Emotionally abused: Not on file    Physically abused: Not on file    Forced sexual activity: Not on file  Other Topics Concern  . Not on file  Social History Narrative  . Not on file     Review of Systems: Not available due to patient's critical condition Gen:   HEENT:  CV:  Resp:  GI: GU :  MS:  Derm:    Psych: Heme:  Neuro:  Endocrine  Vital Signs: Blood pressure (!) 99/49, pulse (!) 52, resp. rate 11, height 5\' 6"  (1.676 m),  weight 63.5 kg, SpO2 (!) 50 %.  No intake or output data in the 24 hours ending 11/22/2018 1614  Weight trends: Filed Weights   11/27/2018 1301  Weight: 63.5 kg    Physical Exam: General:  Critically ill-appearing, laying in the bed in the ER  HEENT  mouth appears dry  Neck:  No JVD  Lungs:  Bilateral coarse crackles  Heart::  Bradycardic, irregular  Abdomen:  Soft, nontender, nondistended, sluggish bowel sounds  Extremities:  No peripheral edema  Neurologic:  Alert but not able to answer any questions,  Skin:  Dry, decreased turgor             Lab results: Basic Metabolic Panel: Recent Labs  Lab 11/26/2018 1313  NA 133*  K >7.5*  CL 109  CO2 15*  GLUCOSE 126*  BUN 85*  CREATININE 1.72*  CALCIUM 8.9    Liver Function Tests: Recent Labs  Lab 11/22/2018 1313  AST 96*  ALT 103*  ALKPHOS 122  BILITOT 0.4  PROT 5.9*  ALBUMIN 3.7   No results for input(s): LIPASE, AMYLASE in the last 168 hours. No results for input(s): AMMONIA in the last 168 hours.  CBC: Recent Labs  Lab 11/22/2018 1313  WBC 6.5  NEUTROABS 5.3  HGB 8.1*  HCT 26.6*  MCV 98.5  PLT 51*    Cardiac Enzymes: No results for input(s): CKTOTAL, TROPONINI in the last 168 hours.  BNP: Invalid input(s): POCBNP  CBG: No results for input(s): GLUCAP in the last 168 hours.  Microbiology: Recent Results (from the past 720 hour(s))  SARS Coronavirus 2 (CEPHEID - Performed in [place] hospital lab), Hosp Order     Status: None   Collection Time: 10/31/18  1:14 PM  Result Value Ref Range Status   SARS Coronavirus 2 NEGATIVE NEGATIVE Final    Comment: (NOTE) If result is NEGATIVE SARS-CoV-2 target nucleic acids are NOT DETECTED. The SARS-CoV-2 RNA is generally detectable in upper and lower  respiratory specimens  during the acute phase of infection. The lowest  concentration of SARS-CoV-2 viral copies this assay can detect is 250  copies / mL. A negative result does not preclude SARS-CoV-2 infection  and should not be used as the sole basis for treatment or other  patient management decisions.  A negative result may occur with  improper specimen collection / handling, submission of specimen other  than nasopharyngeal swab, presence of viral mutation(s) within the  areas targeted by this assay, and inadequate number of viral copies  (<250 copies / mL). A negative result must be combined with clinical  observations, patient history, and epidemiological information. If result is POSITIVE SARS-CoV-2 target nucleic acids are DETECTED. The SARS-CoV-2 RNA is generally detectable in upper and lower  respiratory specimens dur ing the acute phase of infection.  Positive  results are indicative of active infection with SARS-CoV-2.  Clinical  correlation with patient history and other diagnostic information is  necessary to determine patient infection status.  Positive results do  not rule out bacterial infection or co-infection with other viruses. If result is PRESUMPTIVE POSTIVE SARS-CoV-2 nucleic acids MAY BE PRESENT.   A presumptive positive result was obtained on the submitted specimen  and confirmed on repeat testing.  While 2019 novel coronavirus  (SARS-CoV-2) nucleic acids may be present in the submitted sample  additional confirmatory testing may be necessary for epidemiological  and / or clinical management purposes  to differentiate between  SARS-CoV-2 and other Sarbecovirus currently known to  infect humans.  If clinically indicated additional testing with an alternate test  methodology [phone]) is advised. The SARS-CoV-2 RNA is generally  detectable in upper and lower respiratory sp ecimens during the acute  phase of infection. The expected result is Negative. Fact Sheet for Patients:   StrictlyIdeas.no Fact Sheet for Healthcare Providers: BankingDealers.co.za This test is not yet approved or cleared by the [place] FDA and has been authorized for detection and/or diagnosis of SARS-CoV-2 by FDA under an Emergency Use Authorization (EUA).  This EUA will remain in effect (meaning this test can be used) for the duration of the COVID-19 declaration under Section 564(b)(1) of the Act, 21 U.S.C. section 360bbb-3(b)(1), unless the authorization is terminated or revoked sooner. Performed at [facility], [address] [place], [place] [address]      Coagulation Studies: Recent Labs    11/09/2018 1313  LABPROT 13.6  INR 1.1    Urinalysis: No results for input(s): COLORURINE, LABSPEC, PHURINE, GLUCOSEU, HGBUR, BILIRUBINUR, KETONESUR, PROTEINUR, UROBILINOGEN, NITRITE, LEUKOCYTESUR in the last 72 hours.  Invalid input(s): APPERANCEUR      Imaging: Dg Chest 1 View  Result Date: 11/04/2018 CLINICAL DATA:  Speech difficulty EXAM: CHEST  1 VIEW COMPARISON:  09/12/2018 FINDINGS: Cardiac shadow is stable. Aortic calcifications are again seen. The lungs are well aerated bilaterally. Mild vascular congestion is noted without significant interstitial edema. No acute bony abnormality is noted. IMPRESSION: No focal infiltrate or sizable effusion. Mild vascular congestion is noted. Electronically Signed   By: Inez Catalina M.D.   On: 11/28/2018 13:38   Ct Head Wo Contrast  Result Date: 11/25/2018 CLINICAL DATA:  Acute presentation with altered mental status and speech disturbance. EXAM: CT HEAD WITHOUT CONTRAST TECHNIQUE: Contiguous axial images were obtained from the base of the skull through the vertex without intravenous contrast. COMPARISON:  10/17/2018 and multiple previous FINDINGS: Brain: Generalized atrophy. Chronic small-vessel ischemic changes of the cerebral hemispheric white matter. No sign acute infarction, mass  lesion hemorrhage hydrocephalus or extra-axial collection. Vascular: There is atherosclerotic calcification of the major vessels at the base of the brain. Skull: Negative Sinuses/Orbits: No inflammatory sinus disease.  Orbits negative. Other: None IMPRESSION: No acute finding by CT. Atrophy and chronic small-vessel ischemic changes of the hemispheric white matter. Electronically Signed   By: Nelson Chimes M.D.   On: 11/19/2018 14:18      Assessment & Plan: Pt is a 85 y.o. Caucasian  female with  congestive heart failure, dementia, diabetes, hyperlipidemia, history of hypertension and pulmonary embolism, past h/o uterine cancer, thyroid problems,currently nursing home resident was admitted on 11/21/2018 with acute renal failure, severe hyperkalemia, bradycardia, hypotension.  Patient had a recent admission in April 2020 for acute kidney injury, hyperkalemia, E. coli UTI  1.  Acute kidney injury with severe hyperkalemia Oliguric Likely secondary to hypotension of unclear cause Recommend work-up for cardiac causes including CHF exacerbation, MI, sepsis Avoid hypotension, nephrotoxins such as NSAIDs, iv contrast  2.  Severe hyperkalemia -Treated with shifting measures in the emergency room along with IV fluid bolus -Outpatient medication list was reviewed from April 14.  Patient not on ACE inhibitor or potassium supplementation  3.  Severe bradycardia -Possibly related to severe hyperkalemia -Repeat serum potassium after shifting measures and IV fluid bolus -If refractory, can consider acute hemodialysis  4. Recommend palliative care discussion with family to clarify goals of care as patient is critically ill and underlying prognosis appears poor despite aggressive care.      LOS:  0 Rasheen Schewe 6/1/20204:14 PM    Note: This note was prepared with Dragon dictation. Any transcription errors are unintentional

## 2018-10-31 NOTE — Progress Notes (Signed)
eLink Physician-Brief Progress Note Patient Name: Deborah Case DOB: 05/27/1930 MRN: [id number]   Date of Service  11/15/2018  HPI/Events of Note  85 yo female admitted with acute encephalopathy, acute renal failure with hyperkalemia, acute hypoxic respiratory failure requiring Bipap, bradycardia and hypotensive requiring dopamine gtt. PCCM asked to assume care in ICU. HR = 35-45. Dopamine IV infusion being titrated for HR. Patient is now a DNR and family discussing comfort measures according to ground team at [facility].  eICU Interventions  No new orders.     Intervention Category Evaluation Type: New Patient Evaluation  Lysle Dingwall 11/18/2018, 11:51 PM

## 2018-10-31 NOTE — ED Notes (Signed)
Bladder scan completed, 13ml of urine noted. As per Md no foley need, pure wick applied.

## 2018-10-31 NOTE — ED Triage Notes (Signed)
Patient from [place] BY CARE TAKER WITH C/O OF UNABLE TO [place]. PATIENT USUALLY SPEAKS CLEARLY AS PER EMS. LAST WELL KNOWN AS PER EMS YESTERDAY, TIME UNKNOWN.

## 2018-10-31 NOTE — ED Notes (Signed)
ED TO INPATIENT HANDOFF REPORT  ED Nurse Name and Phone #:    S Name/Age/Gender Deborah Case 85 y.o. female Room/Bed: ED05A/ED05A  Code Status   Code Status: Full Code  Home/SNF/Other Nursing Home Patient oriented to: self Is this baseline? No   Triage Complete: Triage complete  Chief Complaint ams  Triage Note Patient from [place] BY CARE TAKER WITH C/O OF UNABLE TO PEAK TODAY. PATIENT USUALLY SPEAKS CLEARLY AS PER EMS. LAST WELL KNOWN AS PER EMS YESTERDAY, TIME UNKNOWN.    Allergies No Known Allergies  Level of Care/Admitting Diagnosis ED Disposition    ED Disposition Condition [facility]: [place] [[id number]]  Level of Care: ICU [6]  Covid Evaluation: Screening Protocol (No Symptoms)  Diagnosis: Hyperkalemia [329518]  Admitting Physician: Otila Back [[id number]]  Attending Physician: Otila Back [[id number]]  Estimated length of stay: past midnight tomorrow  Certification:: I certify this patient will need inpatient services for at least 2 midnights  PT Class (Do Not Modify): Inpatient [101]  PT Acc Code (Do Not Modify): Private [1]       B Medical/Surgery History Past Medical History:  Diagnosis Date  . Cancer ([place])    uterine  . CHF (congestive heart failure) ([place])   . Dementia ([place])   . Diabetes mellitus without complication ([place])   . Hypercholesteremia   . Hypertension   . Pulmonary embolism ([place])   . Thyroid disease   . TIA (transient ischemic attack)    Past Surgical History:  Procedure Laterality Date  . ABDOMINAL HYSTERECTOMY       A IV Location/Drains/Wounds Patient Lines/Drains/Airways Status   Active Line/Drains/Airways    Name:   Placement date:   Placement time:   Site:   Days:   Peripheral IV 11/21/2018 Right   11/27/2018    1307    -   less than 1   Peripheral IV 11/27/2018 Left Antecubital   11/10/2018    1522    Antecubital   less than 1          Intake/Output Last 24  hours  Intake/Output Summary (Last 24 hours) at 11/24/2018 1955 Last data filed at 11/20/2018 1819 Gross per 24 hour  Intake 1430.21 ml  Output -  Net 1430.21 ml    Labs/Imaging Results for orders placed or performed during the hospital encounter of 11/26/2018 (from the past 48 hour(s))  Ethanol     Status: None   Collection Time: 11/18/2018  1:13 PM  Result Value Ref Range   Alcohol, Ethyl (B) <10 <10 mg/dL    Comment: (NOTE) Lowest detectable limit for serum alcohol is 10 mg/dL. For medical purposes only. Performed at [facility], [address] [place], [place], [place] [address]   Protime-INR     Status: None   Collection Time: 11/28/2018  1:13 PM  Result Value Ref Range   Prothrombin Time 13.6 11.4 - 15.2 seconds   INR 1.1 0.8 - 1.2    Comment: (NOTE) INR goal varies based on device and disease states. Performed at [facility], [address] [place], [place] [address]   APTT     Status: Abnormal   Collection Time: 11/23/2018  1:13 PM  Result Value Ref Range   aPTT 42 (H) 24 - 36 seconds    Comment:        IF BASELINE aPTT IS ELEVATED, SUGGEST PATIENT RISK ASSESSMENT BE USED TO DETERMINE APPROPRIATE ANTICOAGULANT  THERAPY. Performed at [facility], [address] [place], [place] [address]   CBC     Status: Abnormal   Collection Time: 11/13/2018  1:13 PM  Result Value Ref Range   WBC 6.5 4.0 - 10.5 K/uL   RBC 2.70 (L) 3.87 - 5.11 MIL/uL   Hemoglobin 8.1 (L) 12.0 - 15.0 g/dL   HCT 26.6 (L) 36.0 - 46.0 %   MCV 98.5 80.0 - 100.0 fL   MCH 30.0 26.0 - 34.0 pg   MCHC 30.5 30.0 - 36.0 g/dL   RDW 16.4 (H) 11.5 - 15.5 %   Platelets 51 (L) 150 - 400 K/uL    Comment: Immature Platelet Fraction may be clinically indicated, consider ordering this additional test [id number]    nRBC 0.0 0.0 - 0.2 %    Comment: Performed at [facility], [address] [place], [place] [address]  Differential     Status: None   Collection Time: 11/17/2018  1:13  PM  Result Value Ref Range   Neutrophils Relative % 81 %   Neutro Abs 5.3 1.7 - 7.7 K/uL   Lymphocytes Relative 13 %   Lymphs Abs 0.8 0.7 - 4.0 K/uL   Monocytes Relative 5 %   Monocytes Absolute 0.3 0.1 - 1.0 K/uL   Eosinophils Relative 0 %   Eosinophils Absolute 0.0 0.0 - 0.5 K/uL   Basophils Relative 0 %   Basophils Absolute 0.0 0.0 - 0.1 K/uL   WBC Morphology MORPHOLOGY UNREMARKABLE    Smear Review PLATELETS APPEAR DECREASED    Immature Granulocytes 1 %   Abs Immature Granulocytes 0.03 0.00 - 0.07 K/uL   Burr Cells PRESENT     Comment: Performed at [facility], [address] [place], [place] [address]  Comprehensive metabolic panel     Status: Abnormal   Collection Time: 11/03/2018  1:13 PM  Result Value Ref Range   Sodium 133 (L) 135 - 145 mmol/L   Potassium >7.5 (HH) 3.5 - 5.1 mmol/L    Comment: CRITICAL RESULT CALLED TO, READ BACK BY AND VERIFIED WITH JEANETTE RAMOS PEREZ AT 1403 ON 11/27/2018 KLM   Chloride 109 98 - 111 mmol/L   CO2 15 (L) 22 - 32 mmol/L   Glucose, Bld 126 (H) 70 - 99 mg/dL   BUN 85 (H) 8 - 23 mg/dL   Creatinine, Ser 1.72 (H) 0.44 - 1.00 mg/dL   Calcium 8.9 8.9 - 10.3 mg/dL   Total Protein 5.9 (L) 6.5 - 8.1 g/dL   Albumin 3.7 3.5 - 5.0 g/dL   AST 96 (H) 15 - 41 U/L   ALT 103 (H) 0 - 44 U/L   Alkaline Phosphatase 122 38 - 126 U/L   Total Bilirubin 0.4 0.3 - 1.2 mg/dL   GFR calc non Af Amer 26 (L) >60 mL/min   GFR calc Af Amer 30 (L) >60 mL/min   Anion gap 9 5 - 15    Comment: Performed at [facility], [address] [place], [place] [address]  SARS Coronavirus 2 (CEPHEID - Performed in [place] hospital lab), Hosp Order     Status: None   Collection Time: 11/05/2018  1:14 PM  Result Value Ref Range   SARS Coronavirus 2 NEGATIVE NEGATIVE    Comment: (NOTE) If result is NEGATIVE SARS-CoV-2 target nucleic acids are NOT DETECTED. The SARS-CoV-2 RNA is generally detectable in upper and lower  respiratory specimens during the acute  phase of infection. The lowest  concentration of SARS-CoV-2 viral copies this assay can detect is 250  copies / mL. A negative result does not preclude SARS-CoV-2 infection  and should not be used as the sole basis for treatment or other  patient management decisions.  A negative result may occur with  improper specimen collection / handling, submission of specimen other  than nasopharyngeal swab, presence of viral mutation(s) within the  areas targeted by this assay, and inadequate number of viral copies  (<250 copies / mL). A negative result must be combined with clinical  observations, patient history, and epidemiological information. If result is POSITIVE SARS-CoV-2 target nucleic acids are DETECTED. The SARS-CoV-2 RNA is generally detectable in upper and lower  respiratory specimens dur ing the acute phase of infection.  Positive  results are indicative of active infection with SARS-CoV-2.  Clinical  correlation with patient history and other diagnostic information is  necessary to determine patient infection status.  Positive results do  not rule out bacterial infection or co-infection with other viruses. If result is PRESUMPTIVE POSTIVE SARS-CoV-2 nucleic acids MAY BE PRESENT.   A presumptive positive result was obtained on the submitted specimen  and confirmed on repeat testing.  While 2019 novel coronavirus  (SARS-CoV-2) nucleic acids may be present in the submitted sample  additional confirmatory testing may be necessary for epidemiological  and / or clinical management purposes  to differentiate between  SARS-CoV-2 and other Sarbecovirus currently known to infect humans.  If clinically indicated additional testing with an alternate test  methodology [id number]) is advised. The SARS-CoV-2 RNA is generally  detectable in upper and lower respiratory sp ecimens during the acute  phase of infection. The expected result is Negative. Fact Sheet for Patients:   StrictlyIdeas.no Fact Sheet for Healthcare Providers: BankingDealers.co.za This test is not yet approved or cleared by the [place] FDA and has been authorized for detection and/or diagnosis of SARS-CoV-2 by FDA under an Emergency Use Authorization (EUA).  This EUA will remain in effect (meaning this test can be used) for the duration of the COVID-19 declaration under Section 564(b)(1) of the Act, 21 U.S.C. section 360bbb-3(b)(1), unless the authorization is terminated or revoked sooner. Performed at [facility], [address] [place], [place] [address]   Basic metabolic panel     Status: Abnormal   Collection Time: 11/29/2018  2:05 PM  Result Value Ref Range   Sodium 138 135 - 145 mmol/L   Potassium 6.2 (H) 3.5 - 5.1 mmol/L   Chloride 112 (H) 98 - 111 mmol/L   CO2 21 (L) 22 - 32 mmol/L   Glucose, Bld 114 (H) 70 - 99 mg/dL   BUN 81 (H) 8 - 23 mg/dL   Creatinine, Ser 1.60 (H) 0.44 - 1.00 mg/dL   Calcium 8.9 8.9 - 10.3 mg/dL   GFR calc non Af Amer 29 (L) >60 mL/min   GFR calc Af Amer 33 (L) >60 mL/min   Anion gap 5 5 - 15    Comment: Performed at [facility] [place], [address], [place], [place] [address]   Dg Chest 1 View  Result Date: 11/27/2018 CLINICAL DATA:  Speech difficulty EXAM: CHEST  1 VIEW COMPARISON:  09/12/2018 FINDINGS: Cardiac shadow is stable. Aortic calcifications are again seen. The lungs are well aerated bilaterally. Mild vascular congestion is noted without significant interstitial edema. No acute bony abnormality is noted. IMPRESSION: No focal infiltrate or sizable effusion. Mild vascular congestion is noted. Electronically Signed   By: Inez Catalina  M.D.   On: 11/19/2018 13:38   Ct Head Wo Contrast  Result Date: 11/28/2018 CLINICAL DATA:  Acute presentation with altered mental status and speech disturbance. EXAM: CT HEAD WITHOUT CONTRAST TECHNIQUE: Contiguous axial images were obtained from the  base of the skull through the vertex without intravenous contrast. COMPARISON:  10/17/2018 and multiple previous FINDINGS: Brain: Generalized atrophy. Chronic small-vessel ischemic changes of the cerebral hemispheric white matter. No sign acute infarction, mass lesion hemorrhage hydrocephalus or extra-axial collection. Vascular: There is atherosclerotic calcification of the major vessels at the base of the brain. Skull: Negative Sinuses/Orbits: No inflammatory sinus disease.  Orbits negative. Other: None IMPRESSION: No acute finding by CT. Atrophy and chronic small-vessel ischemic changes of the hemispheric white matter. Electronically Signed   By: Nelson Chimes M.D.   On: 11/10/2018 14:18    Pending Labs Unresulted Labs (From admission, onward)    Start     Ordered   2018-11-23 0500  TSH  Tomorrow morning,   STAT     11/06/2018 1626   11/23/18 4536  Basic metabolic panel  Tomorrow morning,   STAT     11/02/2018 1626   2018-11-23 0500  CBC  Tomorrow morning,   STAT     11/08/2018 1626   11/23/18 0500  Magnesium  Tomorrow morning,   STAT     11/05/2018 1626   November 23, 2018 0500  Phosphorus  Tomorrow morning,   STAT     11/24/2018 1626   Nov 23, 2018 0500  Hemoglobin A1c  Tomorrow morning,   STAT    Comments:  To assess prior glycemic control    11/08/2018 1719   11/02/2018 4680  Basic metabolic panel  Once,   STAT     11/24/2018 1716   11/05/2018 1430  Urinalysis, Routine w reflex microscopic  Once,   R     11/17/2018 1430   11/15/2018 1310  Urinalysis, Complete w Microscopic  Once,   STAT     11/04/2018 1309   11/20/2018 1310  Urine culture  ONCE - STAT,   STAT     11/12/2018 1309          Vitals/Pain Today's Vitals   11/09/2018 1830 11/18/2018 1845 11/18/2018 1900 11/27/2018 1915  BP: 91/60 110/76  (!) 108/31  Pulse: (!) 49 (!) 44 (!) 58 (!) 40  Resp: 12 10 12 10   TempSrc:      SpO2: 93% 95% 95% 95%  Weight:      Height:      PainSc:        Isolation Precautions No active isolations  Medications Medications   DOPamine (INTROPIN) 800 mg in dextrose 5 % 250 mL (3.2 mg/mL) infusion (15 mcg/kg/min  63.5 kg Intravenous Rate/Dose Change 11/03/2018 1620)  insulin aspart (novoLOG) injection 0-5 Units (has no administration in time range)  insulin aspart (novoLOG) injection 0-9 Units (has no administration in time range)  atropine 1 MG/10ML injection 0.5 mg (0.5 mg Intravenous Given 11/02/2018 1424)  calcium gluconate inj 10% (1 g) URGENT USE ONLY! (1 g Intravenous Given 11/23/2018 1419)  sodium bicarbonate injection 50 mEq (50 mEq Intravenous Given 11/27/2018 1419)  dextrose 50 % solution 25 mL (25 mLs Intravenous Given 11/29/2018 1420)  insulin aspart (novoLOG) injection 10 Units (10 Units Subcutaneous Given 11/06/2018 1421)  calcium gluconate 10 % injection (  Given 11/07/2018 1524)  0.9 %  sodium chloride infusion (0 mLs Intravenous Stopped 10/31/18 1648)  calcium gluconate 1 g/ 50 mL sodium chloride IVPB (0 g  Intravenous Stopped 11/16/2018 1648)  0.9 %  sodium chloride infusion ( Intravenous Rate/Dose Verify 11/19/2018 1819)    Mobility walks with person assist High fall risk   Focused Assessments Cardiac Assessment Handoff:    Lab Results  Component Value Date   CKTOTAL 93 09/11/2018   TROPONINI <0.03 09/05/2018   No results found for: DDIMER Does the Patient currently have chest pain? No     R Recommendations: See Admitting Provider Note  Report given to:   Additional Notes:

## 2018-10-31 NOTE — ED Notes (Signed)
Patient abd distended moans and groans with palpation. 75f temp sensing foly placed 300cc urine output noted. Specimen sent to lab.

## 2018-10-31 NOTE — ED Notes (Signed)
Dopamine infusion increased to 6mcg/kg/min for BP 69/53. Primary RN aware.

## 2018-10-31 NOTE — H&P (Addendum)
[facility] [place] NAME: Deborah Case    MR#:  [id number]  DATE OF BIRTH:  [birth year]  DATE OF ADMISSION:  11/22/2018  PRIMARY CARE PHYSICIAN: Hortencia Pilar, MD   REQUESTING/REFERRING PHYSICIAN: Lavonia Drafts  CHIEF COMPLAINT:   Chief Complaint  Patient presents with  . Aphasia  Altered mental status  HISTORY OF PRESENT ILLNESS:  Deborah Case  is a 85 y.o. female with a known history of chronic diastolic CHF, dementia, hypertension, TIA and diabetes mellitus who was brought into the emergency room with complaints of altered mental status.  Patient was reported to have evidence of starting of her speech initially.  There was initial concern for possible CVA.  Patient had a stat CT scan of the head done which was negative for any acute findings.  Patient noted to be bradycardic with heart rate down to 27 and later improved to the 40s.  However laboratory studies came back with evidence of hyperkalemia with potassium of 7.5.  Evidence of acute kidney injury with creatinine of 1.72.  Hyperkalemia was treated with caution, bicarb, D50 and insulin.  Patient very lethargic and not safe for p.o. intake.  Nephrologist was consulted and patient already seen by Dr. Candiss Norse.  Patient was reported to have become hypotensive in the emergency room and was given 2 L of IV fluids.  Patient became more short of breath and appeared to have become fluid overloaded.  Subsequently placed on BiPAP.  Patient was later placed on dopamine drip due to persistently low heart rate in the 30s.  Medical service called to admit patient for further evaluation.  Patient being admitted to the intensive care unit.  I discussed with the intensive care physician Dr. Lanney Gins  PAST MEDICAL HISTORY:   Past Medical History:  Diagnosis Date  . Cancer ([place])    uterine  . CHF (congestive heart failure) ([place])   . Dementia ([place])   . Diabetes mellitus without complication ([place])   .  Hypercholesteremia   . Hypertension   . Pulmonary embolism ([place])   . Thyroid disease   . TIA (transient ischemic attack)     PAST SURGICAL HISTORY:   Past Surgical History:  Procedure Laterality Date  . ABDOMINAL HYSTERECTOMY      SOCIAL HISTORY:   Social History   Tobacco Use  . Smoking status: Never Smoker  . Smokeless tobacco: Never Used  Substance Use Topics  . Alcohol use: No    FAMILY HISTORY:  History reviewed. No pertinent family history.  DRUG ALLERGIES:  No Known Allergies  REVIEW OF SYSTEMS:   ROS Unobtainable due to patient's current clinical condition.  MEDICATIONS AT HOME:   Prior to Admission medications   Medication Sig Start Date End Date Taking? Authorizing Provider  acetaminophen (TYLENOL) 500 MG tablet Take 500 mg by mouth every 4 (four) hours as needed for mild pain or moderate pain.    [provider]  acetaminophen (TYLENOL) 500 MG tablet Take 500 mg by mouth 2 (two) times a day. (in the morning and afternoon)    [provider]  alum & mag hydroxide-simeth ([place]) 200-200-20 MG/5ML suspension Take 30 mLs by mouth every 6 (six) hours as needed for indigestion or heartburn.    [provider]  amLODipine (NORVASC) 2.5 MG tablet Take 1 tablet (2.5 mg total) by mouth daily. Patient not taking: Reported on 10/17/2018 09/13/18   Sela Hua, MD  atorvastatin (LIPITOR) 80 MG tablet Take  80 mg by mouth at bedtime.    [provider]  carvedilol (COREG) 6.25 MG tablet Take 6.25 mg by mouth daily.     [provider]  cephALEXin (KEFLEX) 500 MG capsule Take 1 capsule (500 mg total) by mouth 2 (two) times daily. For 4 more days Patient not taking: Reported on 10/17/2018 09/13/18   MayoPete Pelt, MD  clopidogrel (PLAVIX) 75 MG tablet Take 75 mg by mouth daily.    [provider]  guaifenesin (ROBITUSSIN) 100 MG/5ML syrup Take 200 mg by mouth every 6 (six) hours as needed for cough.    [provider]  levothyroxine (SYNTHROID) 112 MCG tablet Take 112 mcg by mouth daily before breakfast.    [provider]  lisinopril (ZESTRIL) 10 MG tablet Take 10 mg by mouth daily.    [provider]  loperamide (IMODIUM) 2 MG capsule Take 2 mg by mouth as needed for diarrhea or loose stools.     [provider]  magnesium hydroxide (MILK OF MAGNESIA) 400 MG/5ML suspension Take 30 mLs by mouth daily as needed for mild constipation.    [provider]  neomycin-bacitracin-polymyxin (NEOSPORIN) ointment Apply 1 application topically as needed for wound care.     [provider]  QUEtiapine (SEROQUEL) 25 MG tablet Take 1-2 tablets (25-50 mg total) by mouth 2 (two) times daily. Take one tablet (25 mg) by mouth in the morning and two tablets (50 mg) at bedtime Patient taking differently: Take 25-50 mg by mouth See admin instructions. 25 mg every morning and 50 mg at bedtime 09/09/18   Salary, Montell D, MD      VITAL SIGNS:  Blood pressure (!) 117/45, pulse (!) 57, resp. rate 10, height 5\' 6"  (1.676 m), weight 63.5 kg, SpO2 95 %.  PHYSICAL EXAMINATION:  Physical Exam  GENERAL:  85 y.o.-year-old patient lying in the bed.  Patient requiring BiPAP EYES: Pupils equal, round, reactive to light and accommodation. No scleral icterus. Extraocular muscles intact.  HEENT: Head atraumatic, normocephalic. Oropharynx and nasopharynx clear.  NECK:  Supple, no jugular venous distention. No thyroid enlargement, no tenderness.  LUNGS: Normal breath sounds bilaterally, no wheezing, rales,rhonchi or crepitation. No use of accessory muscles of respiration.  CARDIOVASCULAR: S1, S2 normal. No murmurs, rubs, or gallops.  ABDOMEN: Soft, nontender, nondistended. Bowel sounds present. No organomegaly or mass.  EXTREMITIES: No pedal edema, cyanosis, or clubbing.  NEUROLOGIC: Lethargic but arousable.  Gait not checked.  PSYCHIATRIC: Lethargic and slightly arousable.  Currently  on BiPAP. SKIN: No obvious rash, lesion, or ulcer.   LABORATORY PANEL:   CBC Recent Labs  Lab 11/12/2018 1313  WBC 6.5  HGB 8.1*  HCT 26.6*  PLT 51*   ------------------------------------------------------------------------------------------------------------------  Chemistries  Recent Labs  Lab 11/16/2018 1313 11/18/2018 1405  NA 133* 138  K >7.5* 6.2*  CL 109 112*  CO2 15* 21*  GLUCOSE 126* 114*  BUN 85* 81*  CREATININE 1.72* 1.60*  CALCIUM 8.9 8.9  AST 96*  --   ALT 103*  --   ALKPHOS 122  --   BILITOT 0.4  --    ------------------------------------------------------------------------------------------------------------------  Cardiac Enzymes No results for input(s): TROPONINI in the last 168 hours. ------------------------------------------------------------------------------------------------------------------  RADIOLOGY:  Dg Chest 1 View  Result Date: 11/16/2018 CLINICAL DATA:  Speech difficulty EXAM: CHEST  1 VIEW COMPARISON:  09/12/2018 FINDINGS: Cardiac shadow is stable. Aortic calcifications are again seen. The lungs are well aerated bilaterally. Mild vascular congestion is noted without  significant interstitial edema. No acute bony abnormality is noted. IMPRESSION: No focal infiltrate or sizable effusion. Mild vascular congestion is noted. Electronically Signed   By: Inez Catalina M.D.   On: 11/10/2018 13:38   Ct Head Wo Contrast  Result Date: 11/20/2018 CLINICAL DATA:  Acute presentation with altered mental status and speech disturbance. EXAM: CT HEAD WITHOUT CONTRAST TECHNIQUE: Contiguous axial images were obtained from the base of the skull through the vertex without intravenous contrast. COMPARISON:  10/17/2018 and multiple previous FINDINGS: Brain: Generalized atrophy. Chronic small-vessel ischemic changes of the cerebral hemispheric white matter. No sign acute infarction, mass lesion hemorrhage hydrocephalus or extra-axial collection. Vascular: There is  atherosclerotic calcification of the major vessels at the base of the brain. Skull: Negative Sinuses/Orbits: No inflammatory sinus disease.  Orbits negative. Other: None IMPRESSION: No acute finding by CT. Atrophy and chronic small-vessel ischemic changes of the hemispheric white matter. Electronically Signed   By: Nelson Chimes M.D.   On: 11/13/2018 14:18      IMPRESSION AND PLAN:   Patient is an 85 year old female with history of chronic diastolic CHF, dementia, hypertension, TIA and diabetes mellitus who was brought into the emergency room with complaints of altered mental status.  Patient was noted to be bradycardic secondary to hyperkalemia with potassium of 7.5  1.  Hyperkalemia with potassium of 7.5. Treated medically in the emergency room with calcium, D50, insulin and bicarb. Unable to give anything by mouth due to patient being lethargic.  Repeat potassium level down to 6.2. Nephrology already consulted and patient seen by Dr. Candiss Norse.  He may consider dialysis if hyperkalemia is refractory.  2.  Bradycardia Secondary to hyperkalemia.  Being treated. Patient was started on dopamine drip in the emergency room due to persistent bradycardia.  Being admitted to the ICU for closer monitoring.  TSH level in a.m.  3.  Acute kidney injury Patient rehydrated with IV fluids in the emergency room and subsequently discontinued due to evidence of fluid overload. Nephrologist already consulted.  Follow-up on BMP in a.m.  4.  Chronic CHF.,  Most likely diastolic 2D echocardiogram requested. We will be cautious with IV fluids due to concern for fluid overload.  5.  Dementia Stable  6.  Diabetes mellitus type 2 Placed on sliding scale insulin coverage.  Glycosylated hemoglobin level in a.m.  7.  Thrombocytopenia No evidence of bleeding.  Follow-up on CBC in a.m.  8.  Acute metabolic encephalopathy Secondary to multiple medical problems listed above.  CT scan of the head negative.   Patient  initially presented with slurred speech.  Although initial CT scan of the head was negative.  If there is still consideration for possible CVA, MRI of the brain for further evaluation can be considered in a.m.  All the records are reviewed and case discussed with ED provider.  CODE STATUS: DNR Galvin Proffer I did later speak with patient's legal guardian Mrs. Trey Paula who had already discussed with patient's 2 daughters.  They all collectively decided to make patient DNR DNI going forward.  Order for DNR placed in the chart.  TOTAL TIME TAKING CARE OF THIS PATIENT: 68 minutes.    Rebeca Valdivia M.D on 11/04/2018 at 5:17 PM  Between 7am to 6pm - Pager - [phone]  After 6pm go to [email or web address] - Proofreader  [facility]  Office  [phone]  CC: Primary care physician; Hortencia Pilar, MD   Note: This dictation was prepared with Dragon dictation along with smaller  Company secretary. Any transcriptional errors that result from this process are unintentional.

## 2018-10-31 NOTE — ED Notes (Signed)
Patient desating into the 54' and 80's on cpap. RT called to evaluated c-pap settings. Family at bedside requesting pain meds for patient.

## 2018-10-31 NOTE — ED Provider Notes (Addendum)
[facility] Emergency Department Provider Note   ____________________________________________    I have reviewed the triage vital signs and the nursing notes.   HISTORY  Chief Complaint Aphasia   History severely limited by altered mental status HPI Deborah Case is a 85 y.o. female who presents with altered mental status.  Patient is moaning and saying unintelligible things, unable to obtain history from patient.  EMS reports the patient is typically able to converse normally..  Past medical history includes dementia, diabetes, CHF  Past Medical History:  Diagnosis Date  . Cancer ([place])    uterine  . CHF (congestive heart failure) ([place])   . Dementia ([place])   . Diabetes mellitus without complication ([place])   . Hypercholesteremia   . Hypertension   . Pulmonary embolism ([place])   . Thyroid disease   . TIA (transient ischemic attack)     Patient Active Problem List   Diagnosis Date Noted  . AKI (acute kidney injury) ([place]) 09/05/2018    Past Surgical History:  Procedure Laterality Date  . ABDOMINAL HYSTERECTOMY      Prior to Admission medications   Medication Sig Start Date End Date Taking? Authorizing Provider  acetaminophen (TYLENOL) 500 MG tablet Take 500 mg by mouth every 4 (four) hours as needed for mild pain or moderate pain.    [provider]  acetaminophen (TYLENOL) 500 MG tablet Take 500 mg by mouth 2 (two) times a day. (in the morning and afternoon)    [provider]  alum & mag hydroxide-simeth ([place]) 200-200-20 MG/5ML suspension Take 30 mLs by mouth every 6 (six) hours as needed for indigestion or heartburn.    [provider]  amLODipine (NORVASC) 2.5 MG tablet Take 1 tablet (2.5 mg total) by mouth daily. Patient not taking: Reported on 10/17/2018 09/13/18   MayoPete Pelt, MD  atorvastatin (LIPITOR) 80 MG tablet Take 80 mg by mouth at bedtime.    [provider]  carvedilol (COREG) 6.25 MG  tablet Take 6.25 mg by mouth daily.     [provider]  cephALEXin (KEFLEX) 500 MG capsule Take 1 capsule (500 mg total) by mouth 2 (two) times daily. For 4 more days Patient not taking: Reported on 10/17/2018 09/13/18   MayoPete Pelt, MD  clopidogrel (PLAVIX) 75 MG tablet Take 75 mg by mouth daily.    [provider]  guaifenesin (ROBITUSSIN) 100 MG/5ML syrup Take 200 mg by mouth every 6 (six) hours as needed for cough.    [provider]  levothyroxine (SYNTHROID) 112 MCG tablet Take 112 mcg by mouth daily before breakfast.    [provider]  lisinopril (ZESTRIL) 10 MG tablet Take 10 mg by mouth daily.    [provider]  loperamide (IMODIUM) 2 MG capsule Take 2 mg by mouth as needed for diarrhea or loose stools.     [provider]  magnesium hydroxide (MILK OF MAGNESIA) 400 MG/5ML suspension Take 30 mLs by mouth daily as needed for mild constipation.    [provider]  neomycin-bacitracin-polymyxin (NEOSPORIN) ointment Apply 1 application topically as needed for wound care.     [provider]  QUEtiapine (SEROQUEL) 25 MG tablet Take 1-2 tablets (25-50 mg total) by mouth 2 (two) times daily. Take one tablet (25 mg) by mouth in the morning and two tablets (50 mg) at bedtime Patient taking differently: Take 25-50 mg by mouth See admin instructions. 25 mg every morning and 50 mg  at bedtime 09/09/18   Salary, Avel Peace, MD     Allergies Patient has no known allergies.  History reviewed. No pertinent family history.  Social History Social History   Tobacco Use  . Smoking status: Never Smoker  . Smokeless tobacco: Never Used  Substance Use Topics  . Alcohol use: No  . Drug use: No    Level 5 caveat: Unable to obtain review of Systems due to altered mental status   ____________________________________________   PHYSICAL EXAM:  VITAL SIGNS: ED Triage Vitals  Enc Vitals Group     BP 11/08/2018 1300 (!) 85/60      Pulse Rate 11/22/2018 1300 (!) 53     Resp 11/27/2018 1400 18     Temp --      Temp Source 11/27/2018 1300 Axillary     SpO2 11/28/2018 1300 100 %     Weight 11/05/2018 1301 63.5 kg (139 lb 15.9 oz)     Height 11/12/2018 1301 1.676 m (5\' 6" )     Head Circumference --      Peak Flow --      Pain Score 11/14/2018 1301 0     Pain Loc --      Pain Edu? --      Excl. in [place]? --     Constitutional: Eyes open, moaning Eyes: PERRLA Head: Atraumatic. Nose: No congestion/rhinnorhea. Mouth/Throat: Mucous membranes are very dry  Cardiovascular: Irregular rhythm, bradycardia, warm and well-perfused. Respiratory: Normal respiratory effort.  No retractions. Lungs CTAB. Gastrointestinal: Soft and nontender.  Mild distention, no tenderness Musculoskeletal:  Warm and well perfused Neurologic: Unable to cooperate for neuro exam, appears to move all extremities equally Skin:  Skin is warm, dry and intact. No rash noted. Psychiatric: Unable to examine  ____________________________________________   LABS (all labs ordered are listed, but only abnormal results are displayed)  Labs Reviewed  APTT - Abnormal; Notable for the following components:      Result Value   aPTT 42 (*)    All other components within normal limits  CBC - Abnormal; Notable for the following components:   RBC 2.70 (*)    Hemoglobin 8.1 (*)    HCT 26.6 (*)    RDW 16.4 (*)    Platelets 51 (*)    All other components within normal limits  COMPREHENSIVE METABOLIC PANEL - Abnormal; Notable for the following components:   Sodium 133 (*)    Potassium >7.5 (*)    CO2 15 (*)    Glucose, Bld 126 (*)    BUN 85 (*)    Creatinine, Ser 1.72 (*)    Total Protein 5.9 (*)    AST 96 (*)    ALT 103 (*)    GFR calc non Af Amer 26 (*)    GFR calc Af Amer 30 (*)    All other components within normal limits  SARS CORONAVIRUS 2 (HOSPITAL ORDER, [facility])  URINE CULTURE  ETHANOL  PROTIME-INR  DIFFERENTIAL   URINALYSIS, COMPLETE (UACMP) WITH MICROSCOPIC  URINALYSIS, ROUTINE W REFLEX MICROSCOPIC   ____________________________________________  EKG  ED ECG REPORT I, Lavonia Drafts, the attending physician, personally viewed and interpreted this ECG.  Date: 11/04/2018  Rhythm: Likely junctional rhythm, bradycardia QRS Axis: Abnormal  Intervals: normal ST/T Wave abnormalities: Nonspecific changes Narrative Interpretation:  abnormal EKG with likely junctional rhythm  ____________________________________________  RADIOLOGY  Chest x-ray unremarkable ET scan unremarkable ____________________________________________   PROCEDURES  Procedure(s) performed: No  Procedures  Critical Care performed: yes  CRITICAL CARE Performed by: Lavonia Drafts   Total critical care time: 60 minutes  Critical care time was exclusive of separately billable procedures and treating other patients.  Critical care was necessary to treat or prevent imminent or life-threatening deterioration.  Critical care was time spent personally by me on the following activities: development of treatment plan with patient and/or surrogate as well as nursing, discussions with consultants, evaluation of patient's response to treatment, examination of patient, obtaining history from patient or surrogate, ordering and performing treatments and interventions, ordering and review of laboratory studies, ordering and review of radiographic studies, pulse oximetry and re-evaluation of patient's condition.  ____________________________________________   INITIAL IMPRESSION / ASSESSMENT AND PLAN / ED COURSE  Pertinent labs & imaging results that were available during my care of the patient were reviewed by me and considered in my medical decision making (see chart for details).  Presents with altered mental status, initial vitals are notable for mild bradycardia and mild hypotension patient is moaning and quite confused.   Concern for possible CVA, infection/sepsis/metabolic encephalopathy.  On exam she appears significantly dehydrated as well.  Will give IV fluids, send labs obtain CT, chest x-ray, urinalysis and closely monitor  Patient has become more bradycardic we will trial 0.5 of atropine.   Minimal response to atropine.  However patient's blood pressure stable at this time, she will go to CT head   Contacted by lab notified of potassium greater than 7.5.  Immediately ordered IV calcium, IV bicarb, IV dextrose, insulin and additional fluids.  Have consulted nephrology, Dr. Candiss Norse will see the patient in the ED ----------------------------------------- 3:37 PM on 11/14/2018 -----------------------------------------  Patient remains significantly bradycardic despite treatment, I have added on dopamine drip, likely blood pressure is stable at this time.  Discussed with hospitalist for admission   ----------------------------------------- 4:03 PM on 11/21/2018 -----------------------------------------  Attempted to contact legal guardian, was put in contact with the social worker, wanted to confirm full [place], social worker said that she would speak to her director "get back to [place] ".  Patient is now having oxygen saturations in the 50s we have started nonrebreather with improvement to the 80s, will need to start BiPAP.  Likely volume overload at this point despite having only received 2 L of fluid and still clinically looking severely dehydrated.  Would like to avoid intubation but this may be necessary    ____________________________________________   FINAL CLINICAL IMPRESSION(S) / ED DIAGNOSES  Final diagnoses:  Acute hyperkalemia  Altered mental status, unspecified altered mental status type  Bradycardia  Hypotension due to hypovolemia        Note:  This document was prepared using Dragon voice recognition software and may include unintentional dictation errors.   Lavonia Drafts, MD  11/03/2018 1538    Lavonia Drafts, MD 11/02/2018 [phone]

## 2018-10-31 NOTE — ED Notes (Signed)
.. ED TO INPATIENT HANDOFF REPORT  ED Nurse Name and Phone #: Deneise Lever [phone]  T Name/Age/Gender Deborah Case 85 y.o. female Room/Bed: ED05A/ED05A  Code Status   Code Status: DNR  Home/SNF/Other Skilled nursing facility Patient oriented to: self Is this baseline? No   Triage Complete: Triage complete  Chief Complaint ams  Triage Note Patient from [facility] BY CARE TAKER WITH C/O OF UNABLE TO PEAK TODAY. PATIENT USUALLY SPEAKS CLEARLY AS PER EMS. LAST WELL KNOWN AS PER EMS YESTERDAY, TIME UNKNOWN.    Allergies No Known Allergies  Level of Care/Admitting Diagnosis ED Disposition    ED Disposition Condition [facility] Area: [place] [100120]  Level of Care: ICU [6]  Covid Evaluation: Screening Protocol (No Symptoms)  Diagnosis: Hyperkalemia [024097]  Admitting Physician: Otila Back [[id number]]  Attending Physician: Otila Back [[id number]]  Estimated length of stay: past midnight tomorrow  Certification:: I certify this patient will need inpatient services for at least 2 midnights  PT Class (Do Not Modify): Inpatient [101]  PT Acc Code (Do Not Modify): Private [1]       B Medical/Surgery History Past Medical History:  Diagnosis Date  . Cancer ([place])    uterine  . CHF (congestive heart failure) ([place])   . Dementia ([place])   . Diabetes mellitus without complication ([place])   . Hypercholesteremia   . Hypertension   . Pulmonary embolism ([place])   . Thyroid disease   . TIA (transient ischemic attack)    Past Surgical History:  Procedure Laterality Date  . ABDOMINAL HYSTERECTOMY       A IV Location/Drains/Wounds Patient Lines/Drains/Airways Status   Active Line/Drains/Airways    Name:   Placement date:   Placement time:   Site:   Days:   Peripheral IV 11/21/2018 Right   11/28/2018    1307    -   less than 1   Peripheral IV 11/23/2018 Left Antecubital   11/27/2018    1522    Antecubital   less than 1           Intake/Output Last 24 hours  Intake/Output Summary (Last 24 hours) at 11/17/2018 2302 Last data filed at 11/04/2018 1958 Gross per 24 hour  Intake 1830.21 ml  Output -  Net 1830.21 ml    Labs/Imaging Results for orders placed or performed during the hospital encounter of 11/23/2018 (from the past 48 hour(s))  Ethanol     Status: None   Collection Time: 11/09/2018  1:13 PM  Result Value Ref Range   Alcohol, Ethyl (B) <10 <10 mg/dL    Comment: (NOTE) Lowest detectable limit for serum alcohol is 10 mg/dL. For medical purposes only. Performed at [facility], [address] [place], [place] [address]   Protime-INR     Status: None   Collection Time: 11/03/2018  1:13 PM  Result Value Ref Range   Prothrombin Time 13.6 11.4 - 15.2 seconds   INR 1.1 0.8 - 1.2    Comment: (NOTE) INR goal varies based on device and disease states. Performed at [facility], [address] [place], [place] [address]   APTT     Status: Abnormal   Collection Time: 11/18/2018  1:13 PM  Result Value Ref Range   aPTT 42 (H) 24 - 36 seconds    Comment:        IF BASELINE aPTT IS ELEVATED, SUGGEST PATIENT RISK ASSESSMENT BE USED TO DETERMINE APPROPRIATE  ANTICOAGULANT THERAPY. Performed at [facility] [address] [place], [place] [address]   CBC     Status: Abnormal   Collection Time: 11/25/2018  1:13 PM  Result Value Ref Range   WBC 6.5 4.0 - 10.5 K/uL   RBC 2.70 (L) 3.87 - 5.11 MIL/uL   Hemoglobin 8.1 (L) 12.0 - 15.0 g/dL   HCT 26.6 (L) 36.0 - 46.0 %   MCV 98.5 80.0 - 100.0 fL   MCH 30.0 26.0 - 34.0 pg   MCHC 30.5 30.0 - 36.0 g/dL   RDW 16.4 (H) 11.5 - 15.5 %   Platelets 51 (L) 150 - 400 K/uL    Comment: Immature Platelet Fraction may be clinically indicated, consider ordering this additional test [id number]    nRBC 0.0 0.0 - 0.2 %    Comment: Performed at [facility], [address] [place], [place] [address]  Differential     Status: None    Collection Time: 11/17/2018  1:13 PM  Result Value Ref Range   Neutrophils Relative % 81 %   Neutro Abs 5.3 1.7 - 7.7 K/uL   Lymphocytes Relative 13 %   Lymphs Abs 0.8 0.7 - 4.0 K/uL   Monocytes Relative 5 %   Monocytes Absolute 0.3 0.1 - 1.0 K/uL   Eosinophils Relative 0 %   Eosinophils Absolute 0.0 0.0 - 0.5 K/uL   Basophils Relative 0 %   Basophils Absolute 0.0 0.0 - 0.1 K/uL   WBC Morphology MORPHOLOGY UNREMARKABLE    Smear Review PLATELETS APPEAR DECREASED    Immature Granulocytes 1 %   Abs Immature Granulocytes 0.03 0.00 - 0.07 K/uL   Burr Cells PRESENT     Comment: Performed at [facility], [address] [place], [place] [address]  Comprehensive metabolic panel     Status: Abnormal   Collection Time: 11/25/2018  1:13 PM  Result Value Ref Range   Sodium 133 (L) 135 - 145 mmol/L   Potassium >7.5 (HH) 3.5 - 5.1 mmol/L    Comment: CRITICAL RESULT CALLED TO, READ BACK BY AND VERIFIED WITH JEANETTE RAMOS PEREZ AT 1403 ON 11/28/2018 KLM   Chloride 109 98 - 111 mmol/L   CO2 15 (L) 22 - 32 mmol/L   Glucose, Bld 126 (H) 70 - 99 mg/dL   BUN 85 (H) 8 - 23 mg/dL   Creatinine, Ser 1.72 (H) 0.44 - 1.00 mg/dL   Calcium 8.9 8.9 - 10.3 mg/dL   Total Protein 5.9 (L) 6.5 - 8.1 g/dL   Albumin 3.7 3.5 - 5.0 g/dL   AST 96 (H) 15 - 41 U/L   ALT 103 (H) 0 - 44 U/L   Alkaline Phosphatase 122 38 - 126 U/L   Total Bilirubin 0.4 0.3 - 1.2 mg/dL   GFR calc non Af Amer 26 (L) >60 mL/min   GFR calc Af Amer 30 (L) >60 mL/min   Anion gap 9 5 - 15    Comment: Performed at [facility], [address], [place], [place] [address]  SARS Coronavirus 2 (CEPHEID - Performed in [facility] hospital lab), Hosp Order     Status: None   Collection Time: 11/10/2018  1:14 PM  Result Value Ref Range   SARS Coronavirus 2 NEGATIVE NEGATIVE    Comment: (NOTE) If result is NEGATIVE SARS-CoV-2 target nucleic acids are NOT DETECTED. The SARS-CoV-2 RNA is generally detectable in upper and lower   respiratory specimens during the acute phase of infection. The lowest  concentration of SARS-CoV-2 viral copies this assay can detect is 250  copies / mL. A negative result does not preclude SARS-CoV-2 infection  and should not be used as the sole basis for treatment or other  patient management decisions.  A negative result may occur with  improper specimen collection / handling, submission of specimen other  than nasopharyngeal swab, presence of viral mutation(s) within the  areas targeted by this assay, and inadequate number of viral copies  (<250 copies / mL). A negative result must be combined with clinical  observations, patient history, and epidemiological information. If result is POSITIVE SARS-CoV-2 target nucleic acids are DETECTED. The SARS-CoV-2 RNA is generally detectable in upper and lower  respiratory specimens dur ing the acute phase of infection.  Positive  results are indicative of active infection with SARS-CoV-2.  Clinical  correlation with patient history and other diagnostic information is  necessary to determine patient infection status.  Positive results do  not rule out bacterial infection or co-infection with other viruses. If result is PRESUMPTIVE POSTIVE SARS-CoV-2 nucleic acids MAY BE PRESENT.   A presumptive positive result was obtained on the submitted specimen  and confirmed on repeat testing.  While 2019 novel coronavirus  (SARS-CoV-2) nucleic acids may be present in the submitted sample  additional confirmatory testing may be necessary for epidemiological  and / or clinical management purposes  to differentiate between  SARS-CoV-2 and other Sarbecovirus currently known to infect humans.  If clinically indicated additional testing with an alternate test  methodology [phone]) is advised. The SARS-CoV-2 RNA is generally  detectable in upper and lower respiratory sp ecimens during the acute  phase of infection. The expected result is Negative. Fact  Sheet for Patients:  StrictlyIdeas.no Fact Sheet for Healthcare Providers: BankingDealers.co.za This test is not yet approved or cleared by the [place] FDA and has been authorized for detection and/or diagnosis of SARS-CoV-2 by FDA under an Emergency Use Authorization (EUA).  This EUA will remain in effect (meaning this test can be used) for the duration of the COVID-19 declaration under Section 564(b)(1) of the Act, 21 U.S.C. section 360bbb-3(b)(1), unless the authorization is terminated or revoked sooner. Performed at [facility], [address] [place], [place] [address]   Urinalysis, Complete w Microscopic     Status: Abnormal   Collection Time: 11/15/2018  1:14 PM  Result Value Ref Range   Color, Urine YELLOW (A) YELLOW   APPearance CLOUDY (A) CLEAR   Specific Gravity, Urine 1.016 1.005 - 1.030   pH 5.0 5.0 - 8.0   Glucose, UA NEGATIVE NEGATIVE mg/dL   Hgb urine dipstick NEGATIVE NEGATIVE   Bilirubin Urine NEGATIVE NEGATIVE   Ketones, ur NEGATIVE NEGATIVE mg/dL   Protein, ur NEGATIVE NEGATIVE mg/dL   Nitrite NEGATIVE NEGATIVE   Leukocytes,Ua NEGATIVE NEGATIVE   RBC / HPF 0-5 0 - 5 RBC/hpf   WBC, UA 0-5 0 - 5 WBC/hpf   Bacteria, UA NONE SEEN NONE SEEN   Squamous Epithelial / LPF 0-5 0 - 5   Mucus PRESENT    Hyaline Casts, UA PRESENT     Comment: Performed at [facility], [address] [place], [place] [address]  Basic metabolic panel     Status: Abnormal   Collection Time: 11/27/2018  2:05 PM  Result Value Ref Range   Sodium 138 135 - 145 mmol/L   Potassium 6.2 (H) 3.5 - 5.1 mmol/L   Chloride 112 (H) 98 - 111 mmol/L   CO2  21 (L) 22 - 32 mmol/L   Glucose, Bld 114 (H) 70 - 99 mg/dL   BUN 81 (H) 8 - 23 mg/dL   Creatinine, Ser 1.60 (H) 0.44 - 1.00 mg/dL   Calcium 8.9 8.9 - 10.3 mg/dL   GFR calc non Af Amer 29 (L) >60 mL/min   GFR calc Af Amer 33 (L) >60 mL/min   Anion gap 5 5 - 15    Comment:  Performed at [facility], [address] [place], [place] [address]  Basic metabolic panel     Status: Abnormal   Collection Time: 11/18/2018  9:29 PM  Result Value Ref Range   Sodium 136 135 - 145 mmol/L   Potassium 6.5 (HH) 3.5 - 5.1 mmol/L    Comment: CRITICAL RESULT CALLED TO, READ BACK BY AND VERIFIED WITH RACQUEL DAVID AT 2204 ON 11/27/2018 JJB    Chloride 111 98 - 111 mmol/L   CO2 20 (L) 22 - 32 mmol/L   Glucose, Bld 149 (H) 70 - 99 mg/dL   BUN 85 (H) 8 - 23 mg/dL   Creatinine, Ser 1.52 (H) 0.44 - 1.00 mg/dL   Calcium 8.9 8.9 - 10.3 mg/dL   GFR calc non Af Amer 31 (L) >60 mL/min   GFR calc Af Amer 35 (L) >60 mL/min   Anion gap 5 5 - 15    Comment: Performed at [facility], [address] [place], [place] [address]   Dg Chest 1 View  Result Date: 11/08/2018 CLINICAL DATA:  Speech difficulty EXAM: CHEST  1 VIEW COMPARISON:  09/12/2018 FINDINGS: Cardiac shadow is stable. Aortic calcifications are again seen. The lungs are well aerated bilaterally. Mild vascular congestion is noted without significant interstitial edema. No acute bony abnormality is noted. IMPRESSION: No focal infiltrate or sizable effusion. Mild vascular congestion is noted. Electronically Signed   By: Inez Catalina M.D.   On: 11/12/2018 13:38   Ct Head Wo Contrast  Result Date: 11/24/2018 CLINICAL DATA:  Acute presentation with altered mental status and speech disturbance. EXAM: CT HEAD WITHOUT CONTRAST TECHNIQUE: Contiguous axial images were obtained from the base of the skull through the vertex without intravenous contrast. COMPARISON:  10/17/2018 and multiple previous FINDINGS: Brain: Generalized atrophy. Chronic small-vessel ischemic changes of the cerebral hemispheric white matter. No sign acute infarction, mass lesion hemorrhage hydrocephalus or extra-axial collection. Vascular: There is atherosclerotic calcification of the major vessels at the base of the brain. Skull: Negative Sinuses/Orbits:  No inflammatory sinus disease.  Orbits negative. Other: None IMPRESSION: No acute finding by CT. Atrophy and chronic small-vessel ischemic changes of the hemispheric white matter. Electronically Signed   By: Nelson Chimes M.D.   On: 11/13/2018 14:18    Pending Labs Unresulted Labs (From admission, onward)    Start     Ordered   2018/11/27 0500  TSH  Tomorrow morning,   STAT     11/24/2018 1626   11-27-2018 6979  Basic metabolic panel  Tomorrow morning,   STAT     11/09/2018 1626   November 27, 2018 0500  CBC  Tomorrow morning,   STAT     11/13/2018 1626   27-Nov-2018 0500  Magnesium  Tomorrow morning,   STAT     10/31/18 1626   11-27-18 0500  Phosphorus  Tomorrow morning,   STAT     10/31/18 1626   2018-11-27 0500  Hemoglobin A1c  Tomorrow morning,   STAT    Comments:  To assess prior glycemic control  11/23/2018 1719   11/20/2018 1310  Urine culture  ONCE - STAT,   STAT     11/09/2018 1309          Vitals/Pain Today's Vitals   11/08/2018 2200 11/22/2018 2215 11/15/2018 2230 11/23/2018 2245  BP: (!) 81/63 (!) 78/44 (!) 60/40 95/73  Pulse: (!) 104 (!) 41 (!) 37 (!) 39  Resp: 15 14 15 14   TempSrc:      SpO2: 98% 99% 91% 97%  Weight:      Height:      PainSc:        Isolation Precautions No active isolations  Medications Medications  DOPamine (INTROPIN) 800 mg in dextrose 5 % 250 mL (3.2 mg/mL) infusion (15 mcg/kg/min  63.5 kg Intravenous Rate/Dose Change 11/23/2018 1620)  insulin aspart (novoLOG) injection 0-5 Units (has no administration in time range)  insulin aspart (novoLOG) injection 0-9 Units (has no administration in time range)  atropine 1 MG/10ML injection 0.5 mg (0.5 mg Intravenous Given 11/25/2018 1424)  calcium gluconate inj 10% (1 g) URGENT USE ONLY! (1 g Intravenous Given 11/03/2018 1419)  sodium bicarbonate injection 50 mEq (50 mEq Intravenous Given 11/29/2018 1419)  dextrose 50 % solution 25 mL (25 mLs Intravenous Given 11/03/2018 1420)  insulin aspart (novoLOG) injection 10 Units (10 Units Subcutaneous  Given 11/03/2018 1421)  calcium gluconate 10 % injection (  Given 11/04/2018 1524)  0.9 %  sodium chloride infusion (0 mLs Intravenous Stopped 11/04/2018 1648)  calcium gluconate 1 g/ 50 mL sodium chloride IVPB (0 g Intravenous Stopped 11/05/2018 1648)  0.9 %  sodium chloride infusion ( Intravenous Stopped 11/23/2018 1958)  morphine 2 MG/ML injection 1 mg (1 mg Intravenous Given 10/31/18 2111)    Mobility non-ambulatory High fall risk   Focused Assessments Cardiac Assessment Handoff:    Lab Results  Component Value Date   CKTOTAL 93 09/11/2018   TROPONINI <0.03 09/05/2018   No results found for: DDIMER Does the Patient currently have chest pain? No     R Recommendations: See Admitting Provider Note  Report given to:   Additional Notes:

## 2018-10-31 NOTE — ED Notes (Signed)
Md at bedside. Patient with auditory wheezing, dopamine infusing. RT called for bipap patient sating 50% with goof wave form.

## 2018-10-31 NOTE — Progress Notes (Signed)
Care Alignment Note  Advanced Directives Documents (Living Will, Power of Attorney) currently in the EHR no advanced directives documents available .  Has the patient discussed their wishes with their family/healthcare power of attorney no.  What does the patient/decision maker understand about their medical condition and the natural course of their disease.  Hyperkalemia.  Bradycardia.  Acute metabolic encephalopathy.  Acute kidney injury.  Thrombocytopenia.  Dementia.  Congestive heart failure.  What is the patient/decision maker's biggest fear or concern for the future Patient is a ward of state.  Patient remains full code.  What is the most important goal for this patient should their health condition worsen Patient is a ward of State.  Remains full code..  Current   Code Status: Full Code  Current code status has been reviewed/updated.  Time spent:20 minutes

## 2018-10-31 NOTE — Consult Note (Addendum)
Name: Deborah Case MRN: [id number] DOB: Mar 23, 1930    ADMISSION DATE:  11/17/2018 CONSULTATION DATE: 10/31/2018  REFERRING MD : Dr. Stark Jock   CHIEF COMPLAINT: Aphasia   BRIEF PATIENT DESCRIPTION:  85 yo female admitted with acute encephalopathy, acute renal failure with hyperkalemia, acute hypoxic respiratory failure requiring Bipap, bradycardia and hypotensive requiring dopamine gtt   SIGNIFICANT EVENTS/STUDIES:  06/1-Pt admitted with ICU 06/1-CT Head revealed no acute finding by CT. Atrophy and chronic small-vessel ischemic changes of the hemispheric white matter.  HISTORY OF PRESENT ILLNESS:   This is an 85 yo female with PMH of TIA, Hypothyroidism, Pulmonary Embolism, HTN, Hypercholesteremia, Type II Diabetes Mellitus, Dementia, Uterine Cancer, and  CHF.  She presented [facility] ER on 06/1 via EMS with altered mental status.  EMS reported at baseline pt able to converse normally, however in the ER pt aphasic.  CT Head negative for acute findings.  In the ER pt became bradycardic requiring 0.5 mg of atropine with minimal improvement of heart rate. The pt does take carvedilol 6.25 mg daily.  Lab results revealed Na+ 133, K+ >7.5, CO2 15, glucose 126, BUN 85, creatinine 1.72, AST 96, ALT 103, hgb 8.1, and platelets 51.  She received iv calcium, iv bicarb, iv dextrose, insulin, and additional iv fluids. Nephrology consulted recommended treating severe hyperkalemia with medications, if pt remains hyperkalemic will consider hemodialysis also recommending palliative care to discuss goals of treatment.  Pt remained bradycardic hr 30's, therefore dopamine gtt initiated.  She was subsequently admitted to the ICU by hospitalist team for additional workup and treatment.    PAST MEDICAL HISTORY :   has a past medical history of Cancer ([place]), CHF (congestive heart failure) ([place]), Dementia ([place]), Diabetes mellitus without complication ([place]), Hypercholesteremia, Hypertension, Pulmonary embolism ([place]), Thyroid  disease, and TIA (transient ischemic attack).  has a past surgical history that includes Abdominal hysterectomy. Prior to Admission medications   Medication Sig Start Date End Date Taking? Authorizing Provider  acetaminophen (TYLENOL) 500 MG tablet Take 500 mg by mouth every 4 (four) hours as needed for mild pain or moderate pain.   Yes [provider]  acetaminophen (TYLENOL) 500 MG tablet Take 500 mg by mouth 2 (two) times a day. (in the morning and afternoon)   Yes [provider]  alum & mag hydroxide-simeth ([place]) 200-200-20 MG/5ML suspension Take 30 mLs by mouth every 6 (six) hours as needed for indigestion or heartburn.   Yes [provider]  amLODipine (NORVASC) 5 MG tablet Take 5 mg by mouth daily.   Yes [provider]  atorvastatin (LIPITOR) 80 MG tablet Take 80 mg by mouth at bedtime.   Yes [provider]  carvedilol (COREG) 6.25 MG tablet Take 6.25 mg by mouth daily.    Yes [provider]  clopidogrel (PLAVIX) 75 MG tablet Take 75 mg by mouth daily.   Yes [provider]  guaifenesin (ROBITUSSIN) 100 MG/5ML syrup Take 200 mg by mouth every 6 (six) hours as needed for cough.   Yes [provider]  levothyroxine (SYNTHROID) 112 MCG tablet Take 112 mcg by mouth daily before breakfast.   Yes [provider]  lisinopril (ZESTRIL) 10 MG tablet Take 10 mg by mouth daily.   Yes [provider]  loperamide (IMODIUM) 2 MG capsule Take 2 mg by mouth as needed for diarrhea or loose stools.    Yes [provider]  magnesium hydroxide (MILK OF MAGNESIA) 400 MG/5ML suspension Take 30 mLs by  mouth daily as needed for mild constipation.   Yes [provider]  neomycin-bacitracin-polymyxin (NEOSPORIN) ointment Apply 1 application topically as needed for wound care.    Yes [provider]  QUEtiapine (SEROQUEL) 25 MG tablet Take 1-2 tablets (25-50 mg total) by mouth 2 (two) times daily.  Take one tablet (25 mg) by mouth in the morning and two tablets (50 mg) at bedtime Patient taking differently: Take 25-50 mg by mouth See admin instructions. 25 mg every morning and 50 mg at bedtime 09/09/18  Yes Salary, Avel Peace, MD   No Known Allergies  FAMILY HISTORY:  family history is not on file. SOCIAL HISTORY:  reports that she has never smoked. She has never used smokeless tobacco. She reports that she does not drink alcohol or use drugs.  REVIEW OF SYSTEMS:   Unable to assess pt confused with incomprehensible speech on Bipap  SUBJECTIVE:  Pt currently on [place]: Pulse Rate:  [34-104] 39 (06/01 2245) Resp:  [9-22] 14 (06/01 2245) BP: (60-134)/(31-102) 95/73 (06/01 2245) SpO2:  [50 %-100 %] 97 % (06/01 2245) Weight:  [63.5 kg] 63.5 kg (06/01 1301)  PHYSICAL EXAMINATION: General: chronically frail ill appearing female, NAD  Neuro: not following commands, withdraws from painful stimulation, PERRL  HEENT: supple, no JVD  Cardiovascular: bradycardia, no R/G  Lungs: crackles throughout, even, non labored  Abdomen: +BS x4, soft, non tender, non distended  Musculoskeletal: 2+ generalized edema  Skin: left knee scabbed over abrasion  Recent Labs  Lab 11/20/2018 1313 11/15/2018 1405 11/18/2018 2129  NA 133* 138 136  K >7.5* 6.2* 6.5*  CL 109 112* 111  CO2 15* 21* 20*  BUN 85* 81* 85*  CREATININE 1.72* 1.60* 1.52*  GLUCOSE 126* 114* 149*   Recent Labs  Lab 11/06/2018 1313  HGB 8.1*  HCT 26.6*  WBC 6.5  PLT 51*   Dg Chest 1 View  Result Date: 11/02/2018 CLINICAL DATA:  Speech difficulty EXAM: CHEST  1 VIEW COMPARISON:  09/12/2018 FINDINGS: Cardiac shadow is stable. Aortic calcifications are again seen. The lungs are well aerated bilaterally. Mild vascular congestion is noted without significant interstitial edema. No acute bony abnormality is noted. IMPRESSION: No focal infiltrate or sizable effusion. Mild vascular congestion is noted. Electronically Signed    By: Inez Catalina M.D.   On: 11/27/2018 13:38   Ct Head Wo Contrast  Result Date: 11/06/2018 CLINICAL DATA:  Acute presentation with altered mental status and speech disturbance. EXAM: CT HEAD WITHOUT CONTRAST TECHNIQUE: Contiguous axial images were obtained from the base of the skull through the vertex without intravenous contrast. COMPARISON:  10/17/2018 and multiple previous FINDINGS: Brain: Generalized atrophy. Chronic small-vessel ischemic changes of the cerebral hemispheric white matter. No sign acute infarction, mass lesion hemorrhage hydrocephalus or extra-axial collection. Vascular: There is atherosclerotic calcification of the major vessels at the base of the brain. Skull: Negative Sinuses/Orbits: No inflammatory sinus disease.  Orbits negative. Other: None IMPRESSION: No acute finding by CT. Atrophy and chronic small-vessel ischemic changes of the hemispheric white matter. Electronically Signed   By: Nelson Chimes M.D.   On: 11/16/2018 14:18    ASSESSMENT / PLAN:  Acute hypoxic respiratory failure secondary to mild vascular congestion  Supplemental O2 for dyspnea and/or hypoxia  Will give 40 mg iv lasix x 1 dose   Bradycardia possibly secondary to severe hyperkalemia and outpatient beta blocker  Hx: HTN, CHF, Hypercholesteremia, Pulmonary Embolism, and TIA  Continuous telemetry monitoring  Echo pending  TSH and troponin pending Continue dopamine gtt  Hold antihypertensives   Acute renal failure with hyperkalemia  Trend BMP Replace electrolytes as indicated  Monitor UOP  Avoid nephrotoxic medications  Nephrology consulted appreciate input   Acute on chronic anemia and thrombocytopenia  VTE px: SCD's avoid chemical prophylaxis  Trend WBC  Monitor for s/sx of bleeding and transfuse for hgb <7  Type II diabetes mellitus Continue SSI   Acute encephalopathy (CT Head 10/31/2018 negative for acute findings) Hx: Dementia  Avoid sedating medications  If mentation does not improve  will obtain MR Brain   -Will consult palliative to discuss goals of care   Marda Stalker, [place] Pager [phone] (please enter 7 digits) PCCM Consult Pager [phone] (please enter 7 digits)

## 2018-11-01 ENCOUNTER — Inpatient Hospital Stay
Admit: 2018-11-01 | Discharge: 2018-11-01 | Disposition: A | Discharge status: Home / Self Care | Payer: Medicare Other | Attending: Internal Medicine | Admitting: Internal Medicine

## 2018-11-01 DIAGNOSIS — Z515 Encounter for palliative care: Secondary | ICD-10-CM

## 2018-11-01 DIAGNOSIS — Z7189 Other specified counseling: Secondary | ICD-10-CM

## 2018-11-01 DIAGNOSIS — R4182 Altered mental status, unspecified: Secondary | ICD-10-CM

## 2018-11-01 LAB — CBC
HCT: 32.9 % — ABNORMAL LOW (ref 36.0–46.0)
Hemoglobin: 10.5 g/dL — ABNORMAL LOW (ref 12.0–15.0)
MCH: 30.3 pg (ref 26.0–34.0)
MCHC: 31.9 g/dL (ref 30.0–36.0)
MCV: 94.8 fL (ref 80.0–100.0)
Platelets: 70 10*3/uL — ABNORMAL LOW (ref 150–400)
RBC: 3.47 MIL/uL — ABNORMAL LOW (ref 3.87–5.11)
RDW: 16.2 % — ABNORMAL HIGH (ref 11.5–15.5)
WBC: 8.1 10*3/uL (ref 4.0–10.5)
nRBC: 0 % (ref 0.0–0.2)

## 2018-11-01 LAB — GLUCOSE, CAPILLARY
Glucose-Capillary: 138 mg/dL — ABNORMAL HIGH (ref 70–99)
Glucose-Capillary: 140 mg/dL — ABNORMAL HIGH (ref 70–99)
Glucose-Capillary: 18 mg/dL — CL (ref 70–99)
Glucose-Capillary: 55 mg/dL — ABNORMAL LOW (ref 70–99)
Glucose-Capillary: 84 mg/dL (ref 70–99)

## 2018-11-01 LAB — TSH: TSH: 48 u[IU]/mL — ABNORMAL HIGH (ref 0.350–4.500)

## 2018-11-01 LAB — ECHOCARDIOGRAM COMPLETE
Height: 66 in
Weight: 2239.87 oz

## 2018-11-01 LAB — BASIC METABOLIC PANEL
Anion gap: 8 (ref 5–15)
BUN: 82 mg/dL — ABNORMAL HIGH (ref 8–23)
CO2: 18 mmol/L — ABNORMAL LOW (ref 22–32)
Calcium: 8.9 mg/dL (ref 8.9–10.3)
Chloride: 112 mmol/L — ABNORMAL HIGH (ref 98–111)
Creatinine, Ser: 1.66 mg/dL — ABNORMAL HIGH (ref 0.44–1.00)
GFR calc Af Amer: 32 mL/min — ABNORMAL LOW (ref >60)
GFR calc non Af Amer: 27 mL/min — ABNORMAL LOW (ref >60)
Glucose, Bld: 130 mg/dL — ABNORMAL HIGH (ref 70–99)
Potassium: 5.2 mmol/L — ABNORMAL HIGH (ref 3.5–5.1)
Sodium: 138 mmol/L (ref 135–145)

## 2018-11-01 LAB — POTASSIUM: Potassium: 6.2 mmol/L — ABNORMAL HIGH (ref 3.5–5.1)

## 2018-11-01 LAB — MAGNESIUM: Magnesium: 1.8 mg/dL (ref 1.7–2.4)

## 2018-11-01 LAB — HEMOGLOBIN A1C
Hgb A1c MFr Bld: 5.7 % — ABNORMAL HIGH (ref 4.8–5.6)
Mean Plasma Glucose: 116.89 mg/dL

## 2018-11-01 LAB — MRSA PCR SCREENING: MRSA by PCR: NEGATIVE

## 2018-11-01 LAB — TROPONIN I: Troponin I: <0.03 ng/mL (ref <0.03)

## 2018-11-01 LAB — PHOSPHORUS: Phosphorus: 4.3 mg/dL (ref 2.5–4.6)

## 2018-11-01 MED ORDER — DEXTROSE 50 % IV SOLN
25.0000 mL | Freq: Once | INTRAVENOUS | Status: AC
  Administered 2018-11-01: 25 mL via INTRAVENOUS

## 2018-11-01 MED ORDER — HALOPERIDOL LACTATE 2 MG/ML PO CONC
0.5000 mg | ORAL | Status: DC | PRN
  Filled 2018-11-01: qty 0.3

## 2018-11-01 MED ORDER — GLYCOPYRROLATE 0.2 MG/ML IJ SOLN: 0.2000 mg | INTRAMUSCULAR | Status: DC | PRN

## 2018-11-01 MED ORDER — MORPHINE BOLUS VIA INFUSION
2.0000 mg | INTRAVENOUS | Status: DC | PRN
  Administered 2018-11-01: 4 mg via INTRAVENOUS
  Filled 2018-11-01: qty 4

## 2018-11-01 MED ORDER — MORPHINE 100MG IN NS 100ML (1MG/ML) PREMIX INFUSION
2.0000 mg/h | INTRAVENOUS | Status: DC
  Administered 2018-11-01: 15:00:00 2 mg/h via INTRAVENOUS
  Filled 2018-11-01: qty 100

## 2018-11-01 MED ORDER — GLYCOPYRROLATE 1 MG PO TABS
1.0000 mg | ORAL_TABLET | ORAL | Status: DC | PRN
  Filled 2018-11-01: qty 1

## 2018-11-01 MED ORDER — DEXTROSE 50 % IV SOLN
1.0000 | Freq: Once | INTRAVENOUS | Status: AC
  Administered 2018-11-01: 06:00:00 50 mL via INTRAVENOUS
  Filled 2018-11-01: qty 50

## 2018-11-01 MED ORDER — FENTANYL CITRATE (PF) 100 MCG/2ML IJ SOLN: 25.0000 ug | INTRAMUSCULAR | Status: DC | PRN

## 2018-11-01 MED ORDER — ONDANSETRON HCL 4 MG/2ML IJ SOLN: 4.0000 mg | Freq: Four times a day (QID) | INTRAMUSCULAR | Status: DC | PRN

## 2018-11-01 MED ORDER — ACETAMINOPHEN 650 MG RE SUPP: 650.0000 mg | Freq: Four times a day (QID) | RECTAL | Status: DC | PRN

## 2018-11-01 MED ORDER — HALOPERIDOL 0.5 MG PO TABS
0.5000 mg | ORAL_TABLET | ORAL | Status: DC | PRN
  Filled 2018-11-01: qty 1

## 2018-11-01 MED ORDER — HALOPERIDOL LACTATE 5 MG/ML IJ SOLN: 0.5000 mg | INTRAMUSCULAR | Status: DC | PRN

## 2018-11-01 MED ORDER — ONDANSETRON 4 MG PO TBDP
4.0000 mg | ORAL_TABLET | Freq: Four times a day (QID) | ORAL | Status: DC | PRN
  Filled 2018-11-01: qty 1

## 2018-11-01 MED ORDER — LORAZEPAM 2 MG/ML IJ SOLN: 1.0000 mg | INTRAMUSCULAR | Status: DC | PRN

## 2018-11-01 MED ORDER — INSULIN ASPART 100 UNIT/ML IV SOLN
10.0000 [IU] | Freq: Once | INTRAVENOUS | Status: AC
  Administered 2018-11-01: 06:00:00 10 [IU] via INTRAVENOUS
  Filled 2018-11-01: qty 0.1

## 2018-11-01 MED ORDER — BIOTENE DRY MOUTH MT LIQD: 15.0000 mL | OROMUCOSAL | Status: DC | PRN

## 2018-11-01 MED ORDER — ACETAMINOPHEN 325 MG PO TABS: 650.0000 mg | ORAL_TABLET | Freq: Four times a day (QID) | ORAL | Status: DC | PRN

## 2018-11-01 MED ORDER — POLYVINYL ALCOHOL 1.4 % OP SOLN
1.0000 [drp] | Freq: Four times a day (QID) | OPHTHALMIC | Status: DC | PRN
  Filled 2018-11-01: qty 15

## 2018-11-02 LAB — URINE CULTURE: Culture: NO GROWTH

## 2018-11-03 ENCOUNTER — Telehealth: Payer: Self-pay | Admitting: Internal Medicine

## 2018-11-03 NOTE — Telephone Encounter (Signed)
Death certificate has been placed in DK's folder.  

## 2018-11-04 NOTE — Telephone Encounter (Signed)
Death Certificate complete. Jimmy at [facility] aware ready for pickup.

## 2018-11-30 NOTE — Progress Notes (Signed)
Pt received from ED to rm 3. Alert, mubbling, no speech, unable to follow commands. Low HR & BP on Dopamine gtt. Severe bradycardia/ junctional rhythm.Bipap @ 100%. See flowsheet for detail charting. Will continue to monitor closely.

## 2018-11-30 NOTE — Progress Notes (Signed)
CRITICAL CARE NOTE  CC  follow up respiratory failure  SUBJECTIVE Patient remains critically ill Prognosis is guarded On biPAP DNR with multiorgan failure Patient is suffering and dying   BP (!) 87/72 (BP Location: Left Arm)   Pulse (!) 25   Resp 14   Ht 5\' 6"  (1.676 m)   Wt 63.5 kg   SpO2 94%   BMI 22.60 kg/m    I/O last 3 completed shifts: In: 2066.6 [I.V.:1866.6; IV Piggyback:200] Out: 200 [Urine:200] No intake/output data recorded.  SpO2: 94 % O2 Flow Rate (L/min): 100 L/min     REVIEW OF SYSTEMS  PATIENT IS UNABLE TO PROVIDE COMPLETE REVIEW OF SYSTEMS DUE TO SEVERE CRITICAL ILLNESS   PHYSICAL EXAMINATION:  GENERAL:critically ill appearing, +resp distress HEAD: Normocephalic, atraumatic.  EYES: Pupils equal, round, reactive to light.  No scleral icterus.  MOUTH: Moist mucosal membrane. NECK: Supple. No thyromegaly. No nodules. No JVD.  PULMONARY: +rhonchi, +wheezing CARDIOVASCULAR: S1 and S2. Regular rate and rhythm. No murmurs, rubs, or gallops.  GASTROINTESTINAL: Soft, nontender, -distended. No masses. Positive bowel sounds. No hepatosplenomegaly.  MUSCULOSKELETAL: No swelling, clubbing, or edema.  NEUROLOGIC: obtunded, GCS<8 SKIN:intact,warm,dry  MEDICATIONS: I have reviewed all medications and confirmed regimen as documented   CULTURE RESULTS   Recent Results (from the past 240 hour(s))  SARS Coronavirus 2 (CEPHEID - Performed in [place] hospital lab), Hosp Order     Status: None   Collection Time: 11/08/2018  1:14 PM  Result Value Ref Range Status   SARS Coronavirus 2 NEGATIVE NEGATIVE Final    Comment: (NOTE) If result is NEGATIVE SARS-CoV-2 target nucleic acids are NOT DETECTED. The SARS-CoV-2 RNA is generally detectable in upper and lower  respiratory specimens during the acute phase of infection. The lowest  concentration of SARS-CoV-2 viral copies this assay can detect is 250  copies / mL. A negative result does not preclude  SARS-CoV-2 infection  and should not be used as the sole basis for treatment or other  patient management decisions.  A negative result may occur with  improper specimen collection / handling, submission of specimen other  than nasopharyngeal swab, presence of viral mutation(s) within the  areas targeted by this assay, and inadequate number of viral copies  (<250 copies / mL). A negative result must be combined with clinical  observations, patient history, and epidemiological information. If result is POSITIVE SARS-CoV-2 target nucleic acids are DETECTED. The SARS-CoV-2 RNA is generally detectable in upper and lower  respiratory specimens dur ing the acute phase of infection.  Positive  results are indicative of active infection with SARS-CoV-2.  Clinical  correlation with patient history and other diagnostic information is  necessary to determine patient infection status.  Positive results do  not rule out bacterial infection or co-infection with other viruses. If result is PRESUMPTIVE POSTIVE SARS-CoV-2 nucleic acids MAY BE PRESENT.   A presumptive positive result was obtained on the submitted specimen  and confirmed on repeat testing.  While 2019 novel coronavirus  (SARS-CoV-2) nucleic acids may be present in the submitted sample  additional confirmatory testing may be necessary for epidemiological  and / or clinical management purposes  to differentiate between  SARS-CoV-2 and other Sarbecovirus currently known to infect humans.  If clinically indicated additional testing with an alternate test  methodology [phone]) is advised. The SARS-CoV-2 RNA is generally  detectable in upper and lower respiratory sp ecimens during the acute  phase of infection. The expected result is Negative. Fact Sheet for  Patients:  StrictlyIdeas.no Fact Sheet for Healthcare Providers: BankingDealers.co.za This test is not yet approved or cleared by the  [place] FDA and has been authorized for detection and/or diagnosis of SARS-CoV-2 by FDA under an Emergency Use Authorization (EUA).  This EUA will remain in effect (meaning this test can be used) for the duration of the COVID-19 declaration under Section 564(b)(1) of the Act, 21 U.S.C. section 360bbb-3(b)(1), unless the authorization is terminated or revoked sooner. Performed at [facility], [address] [place], [place] [address]   MRSA PCR Screening     Status: None   Collection Time: 11/04/2018 11:54 PM  Result Value Ref Range Status   MRSA by PCR NEGATIVE NEGATIVE Final    Comment:        The GeneXpert MRSA Assay (FDA approved for NASAL specimens only), is one component of a comprehensive MRSA colonization surveillance program. It is not intended to diagnose MRSA infection nor to guide or monitor treatment for MRSA infections. Performed at [facility], [address] [place], [place] [address]           IMAGING    Dg Chest 1 View  Result Date: 11/02/2018 CLINICAL DATA:  Speech difficulty EXAM: CHEST  1 VIEW COMPARISON:  09/12/2018 FINDINGS: Cardiac shadow is stable. Aortic calcifications are again seen. The lungs are well aerated bilaterally. Mild vascular congestion is noted without significant interstitial edema. No acute bony abnormality is noted. IMPRESSION: No focal infiltrate or sizable effusion. Mild vascular congestion is noted. Electronically Signed   By: Inez Catalina M.D.   On: 11/15/2018 13:38   Ct Head Wo Contrast  Result Date: 11/16/2018 CLINICAL DATA:  Acute presentation with altered mental status and speech disturbance. EXAM: CT HEAD WITHOUT CONTRAST TECHNIQUE: Contiguous axial images were obtained from the base of the skull through the vertex without intravenous contrast. COMPARISON:  10/17/2018 and multiple previous FINDINGS: Brain: Generalized atrophy. Chronic small-vessel ischemic changes of the cerebral hemispheric white  matter. No sign acute infarction, mass lesion hemorrhage hydrocephalus or extra-axial collection. Vascular: There is atherosclerotic calcification of the major vessels at the base of the brain. Skull: Negative Sinuses/Orbits: No inflammatory sinus disease.  Orbits negative. Other: None IMPRESSION: No acute finding by CT. Atrophy and chronic small-vessel ischemic changes of the hemispheric white matter. Electronically Signed   By: Nelson Chimes M.D.   On: 11/09/2018 14:18         ASSESSMENT AND PLAN SYNOPSIS  Acute and severe cardiogenic shock and severe resp failure and progressive multiorgan failure-patient is at end of LIFE  Severe ACUTE Hypoxic and Hypercapnic Respiratory Failure -continue biPAP -continue Bronchodilator Therapy -patient is DNR/DNI Patient is dying  [place] PENDING -oxygen as needed -Lasix as tolerated -follow up cardiac enzymes as indicated -follow up cardiology recs    ACUTE KIDNEY INJURY/Renal Failure -follow chem 7 -follow UO -continue Foley Catheter-assess need -Avoid nephrotoxic agents -Recheck creatinine    NEUROLOGY Obtunded and comatosed   SHOCK-CARDIOGENIC -use vasopressors to keep MAP>65 -follow ABG and LA -follow up cultures -emperic ABX -consider stress dose steroids  CARDIAC ICU monitoring   GI GI PROPHYLAXIS as indicated  NUTRITIONAL STATUS DIET-->NPO Constipation protocol as indicated  ENDO - will use ICU hypoglycemic\Hyperglycemia protocol if indicated   ELECTROLYTES -follow labs as needed -replace as needed -pharmacy consultation and following   DVT/GI PRX ordered TRANSFUSIONS AS NEEDED MONITOR FSBS ASSESS the need for LABS as needed   Critical Care Time devoted to patient care  services described in this note is 32 minutes.   Overall, patient is critically ill, prognosis is guarded.  Patient with Multiorgan failure and at high risk for cardiac arrest and death.   Palliative  care to be consulted Patient needs Comfort care measures  Corrin Parker, M.D.  [facility]  Medical Director [place] Director [facility] Cardio-Pulmonary Department

## 2018-11-30 NOTE — Death Summary Note (Signed)
DEATH SUMMARY   Patient Details  Name: Deborah Case MRN: [id number] DOB: 1929/10/04  Admission/Discharge Information   Admit Date:  11-22-18  Date of Death: Date of Death: 11-23-18  Time of Death: Time of Death: 69  Length of Stay: 1  Referring Physician: Hortencia Pilar, MD   Reason(s) for Hospitalization  Severe resp failure  Diagnoses  Preliminary cause of death: ISCHEMIC CARDIOMYOPATHY, DM, MULTIORGAN FAILURE, KIDNEY DISEASE Secondary Diagnoses (including complications and co-morbidities):  Active Problems:   Hyperkalemia   Brief Hospital Course (including significant findings, care, treatment, and services provided and events leading to death)  Admitted for multiorgan failure Failed biPAP, failed vasopressors  Family At bedside, clinical status relayed to family  Updated and notified of patients medical condition-  Progressive multiorgan failure with very low chance of meaningful recovery.  Patient is in dying  Process.  Family understands the situation.  They have consented and agreed to DNR/DNI and would like to proceed with Comfort care measures.   Family are satisfied with Plan of action and management. All questions answered       Pertinent Labs and Studies  Significant Diagnostic Studies Dg Chest 1 View  Result Date: 2018-11-22 CLINICAL DATA:  Speech difficulty EXAM: CHEST  1 VIEW COMPARISON:  09/12/2018 FINDINGS: Cardiac shadow is stable. Aortic calcifications are again seen. The lungs are well aerated bilaterally. Mild vascular congestion is noted without significant interstitial edema. No acute bony abnormality is noted. IMPRESSION: No focal infiltrate or sizable effusion. Mild vascular congestion is noted. Electronically Signed   By: Inez Catalina M.D.   On: 2018-11-22 13:38   Dg Shoulder Right  Result Date: 10/17/2018 CLINICAL DATA:  Status post fall, shoulder pain EXAM: RIGHT SHOULDER - 2+ VIEW COMPARISON:  None. FINDINGS: There is no fracture  or dislocation. The glenohumeral joint is unremarkable. There are moderate degenerative changes of the acromioclavicular joint. IMPRESSION: No acute osseous injury of the right shoulder. Electronically Signed   By: Kathreen Devoid   On: 10/17/2018 12:53   Ct Head Wo Contrast  Result Date: 2018-11-22 CLINICAL DATA:  Acute presentation with altered mental status and speech disturbance. EXAM: CT HEAD WITHOUT CONTRAST TECHNIQUE: Contiguous axial images were obtained from the base of the skull through the vertex without intravenous contrast. COMPARISON:  10/17/2018 and multiple previous FINDINGS: Brain: Generalized atrophy. Chronic small-vessel ischemic changes of the cerebral hemispheric white matter. No sign acute infarction, mass lesion hemorrhage hydrocephalus or extra-axial collection. Vascular: There is atherosclerotic calcification of the major vessels at the base of the brain. Skull: Negative Sinuses/Orbits: No inflammatory sinus disease.  Orbits negative. Other: None IMPRESSION: No acute finding by CT. Atrophy and chronic small-vessel ischemic changes of the hemispheric white matter. Electronically Signed   By: Nelson Chimes M.D.   On: 2018-11-22 14:18   Ct Head Wo Contrast  Result Date: 10/17/2018 CLINICAL DATA:  Recent fall with occipital pain, initial encounter EXAM: CT HEAD WITHOUT CONTRAST TECHNIQUE: Contiguous axial images were obtained from the base of the skull through the vertex without intravenous contrast. COMPARISON:  09/11/2018 FINDINGS: Brain: Chronic atrophic and ischemic changes are seen. No findings to suggest acute hemorrhage, acute infarction or space-occupying mass lesion are noted. Vascular: No hyperdense vessel or unexpected calcification. Skull: Normal. Negative for fracture or focal lesion. Sinuses/Orbits: No acute finding. Other: None. IMPRESSION: Atrophic and ischemic changes without acute abnormality. Electronically Signed   By: Inez Catalina M.D.   On: 10/17/2018 12:22   Dg Knee  Complete 4 Views  Left  Result Date: 10/17/2018 CLINICAL DATA:  Recent fall with knee pain, initial encounter EXAM: LEFT KNEE - COMPLETE 4+ VIEW COMPARISON:  09/11/2018 FINDINGS: No acute fracture or dislocation is noted. Meniscal calcifications are again seen and stable. Diffuse vascular calcifications are noted as well. IMPRESSION: Chronic changes without acute abnormality. Electronically Signed   By: Inez Catalina M.D.   On: 10/17/2018 12:56    Microbiology Recent Results (from the past 240 hour(s))  SARS Coronavirus 2 (CEPHEID - Performed in [facility] hospital lab), Hosp Order     Status: None   Collection Time: 11/02/2018  1:14 PM  Result Value Ref Range Status   SARS Coronavirus 2 NEGATIVE NEGATIVE Final    Comment: (NOTE) If result is NEGATIVE SARS-CoV-2 target nucleic acids are NOT DETECTED. The SARS-CoV-2 RNA is generally detectable in upper and lower  respiratory specimens during the acute phase of infection. The lowest  concentration of SARS-CoV-2 viral copies this assay can detect is 250  copies / mL. A negative result does not preclude SARS-CoV-2 infection  and should not be used as the sole basis for treatment or other  patient management decisions.  A negative result may occur with  improper specimen collection / handling, submission of specimen other  than nasopharyngeal swab, presence of viral mutation(s) within the  areas targeted by this assay, and inadequate number of viral copies  (<250 copies / mL). A negative result must be combined with clinical  observations, patient history, and epidemiological information. If result is POSITIVE SARS-CoV-2 target nucleic acids are DETECTED. The SARS-CoV-2 RNA is generally detectable in upper and lower  respiratory specimens dur ing the acute phase of infection.  Positive  results are indicative of active infection with SARS-CoV-2.  Clinical  correlation with patient history and other diagnostic information is  necessary to  determine patient infection status.  Positive results do  not rule out bacterial infection or co-infection with other viruses. If result is PRESUMPTIVE POSTIVE SARS-CoV-2 nucleic acids MAY BE PRESENT.   A presumptive positive result was obtained on the submitted specimen  and confirmed on repeat testing.  While 2019 novel coronavirus  (SARS-CoV-2) nucleic acids may be present in the submitted sample  additional confirmatory testing may be necessary for epidemiological  and / or clinical management purposes  to differentiate between  SARS-CoV-2 and other Sarbecovirus currently known to infect humans.  If clinically indicated additional testing with an alternate test  methodology [phone]) is advised. The SARS-CoV-2 RNA is generally  detectable in upper and lower respiratory sp ecimens during the acute  phase of infection. The expected result is Negative. Fact Sheet for Patients:  StrictlyIdeas.no Fact Sheet for Healthcare Providers: BankingDealers.co.za This test is not yet approved or cleared by the [place] FDA and has been authorized for detection and/or diagnosis of SARS-CoV-2 by FDA under an Emergency Use Authorization (EUA).  This EUA will remain in effect (meaning this test can be used) for the duration of the COVID-19 declaration under Section 564(b)(1) of the Act, 21 U.S.C. section 360bbb-3(b)(1), unless the authorization is terminated or revoked sooner. Performed at [facility], [address] [place], [place] [address]   MRSA PCR Screening     Status: None   Collection Time: 11/21/2018 11:54 PM  Result Value Ref Range Status   MRSA by PCR NEGATIVE NEGATIVE Final    Comment:        The GeneXpert MRSA Assay (FDA approved for NASAL specimens only), is one component of  a comprehensive MRSA colonization surveillance program. It is not intended to diagnose MRSA infection nor to guide or monitor treatment  for MRSA infections. Performed at [facility], [address] [place], [place] [address]     Lab Basic Metabolic Panel: Recent Labs  Lab 11/21/2018 1313 11/09/2018 1405 11/20/2018 2129 Nov 13, 2018 0001 11-13-2018 0625  NA 133* 138 136  --  138  K >7.5* 6.2* 6.5* 6.2* 5.2*  CL 109 112* 111  --  112*  CO2 15* 21* 20*  --  18*  GLUCOSE 126* 114* 149*  --  130*  BUN 85* 81* 85*  --  82*  CREATININE 1.72* 1.60* 1.52*  --  1.66*  CALCIUM 8.9 8.9 8.9  --  8.9  MG  --   --   --   --  1.8  PHOS  --   --   --   --  4.3   Liver Function Tests: Recent Labs  Lab 11/17/2018 1313  AST 96*  ALT 103*  ALKPHOS 122  BILITOT 0.4  PROT 5.9*  ALBUMIN 3.7   No results for input(s): LIPASE, AMYLASE in the last 168 hours. No results for input(s): AMMONIA in the last 168 hours. CBC: Recent Labs  Lab 11/21/2018 1313 11-13-18 0625  WBC 6.5 8.1  NEUTROABS 5.3  --   HGB 8.1* 10.5*  HCT 26.6* 32.9*  MCV 98.5 94.8  PLT 51* 70*   Cardiac Enzymes: Recent Labs  Lab Nov 13, 2018 0001  TROPONINI <0.03   Sepsis Labs: Recent Labs  Lab 11/20/2018 1313 11-13-2018 0625  WBC 6.5 8.1      Obbie Lewallen 2018/11/13, 3:54 PM

## 2018-11-30 NOTE — Progress Notes (Signed)
MD notified patient has been mute, unable to follow commands. Heart rate 30's-60's. Patient is on Dopamine-max dosage. At this time we will not add any additional drip or pushes for blood pressure or heart rate. MD aware unable to get temperature. Patient is on Retail banker. Blood sugars dropped this am. Dextrose given and blood sugar 140. Decreased urine output. [place] (POA) updated password. Palliative care to touch base.

## 2018-11-30 NOTE — Progress Notes (Signed)
[facility], [place] 11-25-2018  Subjective:   Patient remains critically ill Potassium has improved but she remains on PAP One of her daughters is at bedside   Objective:  Vital signs in last 24 hours:  Pulse Rate:  [25-104] 41 (06/02 1000) Resp:  [9-22] 20 (06/02 1000) BP: (60-260)/(31-240) 75/63 (06/02 1000) SpO2:  [50 %-100 %] 93 % (06/02 1000) Weight:  [63.5 kg] 63.5 kg (06/01 1301)  Weight change:  Filed Weights   11/24/2018 1301  Weight: 63.5 kg    Intake/Output:    Intake/Output Summary (Last 24 hours) at 2018/11/25 1026 Last data filed at 11/25/18 0600 Gross per 24 hour  Intake 2066.56 ml  Output 200 ml  Net 1866.56 ml     Physical Exam: General:  Critically ill-appearing,   HEENT  PAP mask in place  Neck  no masses  Pulm/lungs  bilateral crackles, positive pressure ventilation  CVS/Heart  irregular, bradycardic  Abdomen:   Soft, nontender  Extremities:  Plus pitting edema  Neurologic:  Lethargic, not responding to commands  Skin:  Warm    Foley in place with minimal urine       Basic Metabolic Panel:  Recent Labs  Lab 11/22/2018 1313 11/02/2018 1405 11/10/2018 2129 2018-11-25 0001 11-25-18 0625  NA 133* 138 136  --  138  K >7.5* 6.2* 6.5* 6.2* 5.2*  CL 109 112* 111  --  112*  CO2 15* 21* 20*  --  18*  GLUCOSE 126* 114* 149*  --  130*  BUN 85* 81* 85*  --  82*  CREATININE 1.72* 1.60* 1.52*  --  1.66*  CALCIUM 8.9 8.9 8.9  --  8.9  MG  --   --   --   --  1.8  PHOS  --   --   --   --  4.3     CBC: Recent Labs  Lab 11/26/2018 1313 2018/11/25 0625  WBC 6.5 8.1  NEUTROABS 5.3  --   HGB 8.1* 10.5*  HCT 26.6* 32.9*  MCV 98.5 94.8  PLT 51* 70*     No results found for: HEPBSAG, HEPBSAB, HEPBIGM    Microbiology:  Recent Results (from the past 240 hour(s))  SARS Coronavirus 2 (CEPHEID - Performed in [place] hospital lab), Hosp Order     Status: None   Collection Time: 11/27/2018  1:14 PM  Result Value Ref Range  Status   SARS Coronavirus 2 NEGATIVE NEGATIVE Final    Comment: (NOTE) If result is NEGATIVE SARS-CoV-2 target nucleic acids are NOT DETECTED. The SARS-CoV-2 RNA is generally detectable in upper and lower  respiratory specimens during the acute phase of infection. The lowest  concentration of SARS-CoV-2 viral copies this assay can detect is 250  copies / mL. A negative result does not preclude SARS-CoV-2 infection  and should not be used as the sole basis for treatment or other  patient management decisions.  A negative result may occur with  improper specimen collection / handling, submission of specimen other  than nasopharyngeal swab, presence of viral mutation(s) within the  areas targeted by this assay, and inadequate number of viral copies  (<250 copies / mL). A negative result must be combined with clinical  observations, patient history, and epidemiological information. If result is POSITIVE SARS-CoV-2 target nucleic acids are DETECTED. The SARS-CoV-2 RNA is generally detectable in upper and lower  respiratory specimens dur ing the acute phase of infection.  Positive  results are indicative of  active infection with SARS-CoV-2.  Clinical  correlation with patient history and other diagnostic information is  necessary to determine patient infection status.  Positive results do  not rule out bacterial infection or co-infection with other viruses. If result is PRESUMPTIVE POSTIVE SARS-CoV-2 nucleic acids MAY BE PRESENT.   A presumptive positive result was obtained on the submitted specimen  and confirmed on repeat testing.  While 2019 novel coronavirus  (SARS-CoV-2) nucleic acids may be present in the submitted sample  additional confirmatory testing may be necessary for epidemiological  and / or clinical management purposes  to differentiate between  SARS-CoV-2 and other Sarbecovirus currently known to infect humans.  If clinically indicated additional testing with an alternate  test  methodology [phone]) is advised. The SARS-CoV-2 RNA is generally  detectable in upper and lower respiratory sp ecimens during the acute  phase of infection. The expected result is Negative. Fact Sheet for Patients:  StrictlyIdeas.no Fact Sheet for Healthcare Providers: BankingDealers.co.za This test is not yet approved or cleared by the [place] FDA and has been authorized for detection and/or diagnosis of SARS-CoV-2 by FDA under an Emergency Use Authorization (EUA).  This EUA will remain in effect (meaning this test can be used) for the duration of the COVID-19 declaration under Section 564(b)(1) of the Act, 21 U.S.C. section 360bbb-3(b)(1), unless the authorization is terminated or revoked sooner. Performed at [facility], [address] [place], [place] [address]   MRSA PCR Screening     Status: None   Collection Time: 11/12/2018 11:54 PM  Result Value Ref Range Status   MRSA by PCR NEGATIVE NEGATIVE Final    Comment:        The GeneXpert MRSA Assay (FDA approved for NASAL specimens only), is one component of a comprehensive MRSA colonization surveillance program. It is not intended to diagnose MRSA infection nor to guide or monitor treatment for MRSA infections. Performed at [facility], [address] [place], [place] [address]     Coagulation Studies: Recent Labs    11/23/2018 1313  LABPROT 13.6  INR 1.1    Urinalysis: Recent Labs    11/20/2018 1314  COLORURINE YELLOW*  LABSPEC 1.016  PHURINE 5.0  GLUCOSEU NEGATIVE  HGBUR NEGATIVE  BILIRUBINUR NEGATIVE  KETONESUR NEGATIVE  PROTEINUR NEGATIVE  NITRITE NEGATIVE  LEUKOCYTESUR NEGATIVE      Imaging: Dg Chest 1 View  Result Date: 11/10/2018 CLINICAL DATA:  Speech difficulty EXAM: CHEST  1 VIEW COMPARISON:  09/12/2018 FINDINGS: Cardiac shadow is stable. Aortic calcifications are again seen. The lungs are well aerated  bilaterally. Mild vascular congestion is noted without significant interstitial edema. No acute bony abnormality is noted. IMPRESSION: No focal infiltrate or sizable effusion. Mild vascular congestion is noted. Electronically Signed   By: Inez Catalina M.D.   On: 11/18/2018 13:38   Ct Head Wo Contrast  Result Date: 11/15/2018 CLINICAL DATA:  Acute presentation with altered mental status and speech disturbance. EXAM: CT HEAD WITHOUT CONTRAST TECHNIQUE: Contiguous axial images were obtained from the base of the skull through the vertex without intravenous contrast. COMPARISON:  10/17/2018 and multiple previous FINDINGS: Brain: Generalized atrophy. Chronic small-vessel ischemic changes of the cerebral hemispheric white matter. No sign acute infarction, mass lesion hemorrhage hydrocephalus or extra-axial collection. Vascular: There is atherosclerotic calcification of the major vessels at the base of the brain. Skull: Negative Sinuses/Orbits: No inflammatory sinus disease.  Orbits negative. Other: None IMPRESSION: No acute finding by CT. Atrophy and chronic small-vessel ischemic changes  of the hemispheric white matter. Electronically Signed   By: Nelson Chimes M.D.   On: 11/22/2018 14:18     Medications:   . DOPamine 20 mcg/kg/min (12/01/2018 0415)      Assessment/ Plan:  85 y.o. Caucasian female with  congestive heart failure, dementia, diabetes, hyperlipidemia, history of hypertension and pulmonary embolism, past h/o uterine cancer, thyroid problems,currently nursing home resident was admitted on 11/13/2018 with acute renal failure, severe hyperkalemia, bradycardia, hypotension.  Patient had a recent admission in April 2020 for acute kidney injury, hyperkalemia, E. coli UTI  1.  Acute kidney injury with severe hyperkalemia Oliguric, remains bradycardic and hypotensive,Serum creatinine at 1.66  Echo has been done, results pending Work-up is in progress Avoid nephrotoxins such as NSAIDs, iv  contrast Patient has multiorgan failure and overall Prognosis appears poor. . 2.  Severe hyperkalemia -Treated with shifting measures in the emergency room along with IV fluid bolus -Potassium has improved to 5.2  3.  Severe bradycardia -Possibly related to severe hyperkalemia and underlying cardiac pathology  4.  Acute respiratory failure -Acute pulmonary edema -Requiring positive airway pressure ventilation     LOS: [place] July 02, 202010:26 [place], [place]  Note: This note was prepared with Dragon dictation. Any transcription errors are unintentional

## 2018-11-30 NOTE — Progress Notes (Signed)
   2018-11-29 1500  Clinical Encounter Type  Visited With Patient and family together  Visit Type Initial  Referral From Nurse  Spiritual Encounters  Spiritual Needs Prayer;Grief support  Stress Factors  Family Stress Factors Loss  Pt on comfort care and actively dying. Daughters and granddaughters at bedside. Family requested prayer. Ch encouraged the family to share memories of the pt to help them with their grieving. Family described pt as loving, giving, and fun. Ch prayed with the family.

## 2018-11-30 NOTE — Progress Notes (Signed)
Pt critically ill requiring dopamine gtt due to severe bradycardia hr 35-49 and sbp 70's.  I have spoken to the pts granddaughter regarding decline in pts condition, she states the pts daughter should arrive at bedside soon.  She states they both understand the pts prognosis is poor and she is at high risk for cardiac arrest.  Pt currently DNR. The pt does have a legal guardian Trey Paula I attempted to contact her via telephone, however she did not answer.  Therefore, I contacted [facility] on call social worker number and I am currently waiting on the on call social worker to return my phone call to discuss pt prognosis and plan of care.  Will continue to monitor and assess pt.   Marda Stalker, [place] Pager [phone] (please enter 7 digits) PCCM Consult Pager [phone] (please enter 7 digits)

## 2018-11-30 NOTE — Progress Notes (Signed)
*  PRELIMINARY RESULTS* Echocardiogram 2D Echocardiogram has been performed.  Deborah Case 02-Nov-2018, 9:54 AM

## 2018-11-30 NOTE — Progress Notes (Signed)
Dr Mortimer Fries aware of patients BP and heart. Orders to stop checking BP. Patients arm is swollen and cuff cutting into arm. It will not read on legs. MD spoke with [facility] from [place]. Family is on the way up here for comfort measures.

## 2018-11-30 NOTE — Consult Note (Signed)
Consultation Note Date: 29-Nov-2018   Patient Name: Deborah Case  DOB: 12/23/1929  MRN: [id number]  Age / Sex: 85 y.o., female  PCP: Hortencia Pilar, MD Referring Physician: Nicholes Mango, MD  Reason for Consultation: Establishing goals of care  HPI/Patient Profile: 85 y.o. female  with past medical history of dementia, CHF, HTN, h/o PE, TIA, diabetes, h/o uterine cancer admitted on 11/28/2018 with AMS and ultimately with bradycardia, hypotension, BiPAP dependent, and renal failure. Patient with multiorgan failure and at EOL.   Clinical Assessment and Goals of Care: I have met at Ms. Velez's bedside with daughter, Carlyon Shadow, who has password to have information. We discussed her mother's multiorgan failure and poor prognosis. We have concern for cardiogenic shock and cardiac dysfunction with bradycardia and hypotension ongoing even with high dose dopamine to support. Her respiratory status is being supported by BiPAP and we are concerned for her renal function. Her blood sugar is also dropping critically low. These are all signs of severe organ dysfunction and really a sign that Ms. Waring's body is shutting down and with the complexity of all her issues options are limited. We are recommending a focus on comfort care. Darlene understands that her mother is at EOL and our recommendation is to focus on comfort care and to remove BiPAP. She would like to remain at her mother's bedside and be present when she dies. I informed her that I will speak further with DSS regarding recommendations and will keep her informed. I have left voicemail x 2 with DSS legal guardian (1 voicemail for Ms. Edison Pace and 1 voicemail for her supervisor). Will continue attempts to clarify plan with DSS.   Update 1130: I have spoken with DSS legal guardian Trey Paula regarding recommendations and have submitted these recommendations via email for her to  obtain approval from [place] supervisors. She will also speak with family and assist with recommendations for visitation. Will await to hear back.   Update 1350: Awaiting final decision from DSS and family have left bedside. At this time Ms. Diesing is having discomfort at times and is suffering. I will add PRN medication to minimize her suffering knowing that this may make her hypotension worse but ethically we will not allow her to suffer unnecessarily. RN to contact Carlyon Shadow (daughter) with any further decline. Will await final DSS decision to remove BiPAP and dopamine infusion but will add PRN comfort medications. No escalation of care at this time.   Update 1430: I have received call back from Trey Paula, [place] legal guardian. She has discussed with family and daughters Joanell Rising, and Linda's daughter will be allowed to come visit as we remove BiPAP and transition to full comfort. Comfort orders placed and discussed plan of care with RN. I also explained to family at bedside (all except Darlene and I spoke with her earlier).   Orders placed for RN to make comfortable in order to remove BiPAP and stop dopamine to allow for comfortable and natural death when all family arrive.   Primary  Decision Maker LEGAL GUARDIAN [place] DSS Trey Paula   Code Status/Advance Care Planning:  DNR   Symptom Management:   Medications ordered PRN and infusion to ensure comfort.   Palliative Prophylaxis:   Frequent Pain Assessment, Oral Care and Turn Reposition  Additional Recommendations (Limitations, Scope, Preferences):  Full Comfort Care  Psycho-social/Spiritual:   Desire for further Chaplaincy support:yes  Additional Recommendations: Grief/Bereavement Support  Prognosis:   Hours - Days  Discharge Planning: Anticipated Hospital Death      Primary Diagnoses: Present on Admission: . Hyperkalemia   I have reviewed the medical record, interviewed the patient and family, and examined  the patient. The following aspects are pertinent.  Past Medical History:  Diagnosis Date  . Cancer ([place])    uterine  . CHF (congestive heart failure) ([place])   . Dementia ([place])   . Diabetes mellitus without complication ([place])   . Hypercholesteremia   . Hypertension   . Pulmonary embolism ([place])   . Thyroid disease   . TIA (transient ischemic attack)    Social History   Socioeconomic History  . Marital status: Widowed    Spouse name: Not on file  . Number of children: Not on file  . Years of education: Not on file  . Highest education level: Not on file  Occupational History  . Not on file  Social Needs  . Financial resource strain: Not on file  . Food insecurity:    Worry: Not on file    Inability: Not on file  . Transportation needs:    Medical: Not on file    Non-medical: Not on file  Tobacco Use  . Smoking status: Never Smoker  . Smokeless tobacco: Never Used  Substance and Sexual Activity  . Alcohol use: No  . Drug use: No  . Sexual activity: Never  Lifestyle  . Physical activity:    Days per week: Not on file    Minutes per session: Not on file  . Stress: Not on file  Relationships  . Social connections:    Talks on phone: Not on file    Gets together: Not on file    Attends religious service: Not on file    Active member of club or organization: Not on file    Attends meetings of clubs or organizations: Not on file    Relationship status: Not on file  Other Topics Concern  . Not on file  Social History Narrative  . Not on file   History reviewed. No pertinent family history. Scheduled Meds: Continuous Infusions: . DOPamine 20 mcg/kg/min (Nov 12, 2018 0415)   PRN Meds:. No Known Allergies Review of Systems  Unable to perform ROS: Acuity of condition    Physical Exam Vitals signs and nursing note reviewed.  Constitutional:      General: She is not in acute distress.    Appearance: She is ill-appearing.  Cardiovascular:     Rate and Rhythm:  Bradycardia present.  Pulmonary:     Effort: No tachypnea, accessory muscle usage or respiratory distress.     Comments: BiPAP Abdominal:     Palpations: Abdomen is soft.  Neurological:     Comments: Minimally responsive. Opens eyes and moans on occasion.      Vital Signs: BP (!) 87/69   Pulse (!) 36   Resp 14   Ht '5\' 6"'$  (1.676 m)   Wt 63.5 kg   SpO2 92%   BMI 22.60 kg/m  Pain Scale: PAINAD   Pain Score: 0-No pain  SpO2: SpO2: 92 % O2 Device:SpO2: 92 % O2 Flow Rate: .O2 Flow Rate (L/min): 95 L/min  IO: Intake/output summary:   Intake/Output Summary (Last 24 hours) at 2018-11-15 1017 Last data filed at 11-15-2018 0600 Gross per 24 hour  Intake 2066.56 ml  Output 200 ml  Net 1866.56 ml    LBM:   Baseline Weight: Weight: 63.5 kg Most recent weight: Weight: 63.5 kg     Palliative Assessment/Data:   Flowsheet Rows     Most Recent Value  Intake Tab  Referral Department  Critical care  Unit at Time of Referral  ICU  Date Notified  11/15/2018  Palliative Care Type  New Palliative care  Reason for referral  Clarify Goals of Care  Date of Admission  11/18/2018  # of days IP prior to Palliative referral  1  Clinical Assessment  Psychosocial & Spiritual Assessment  Palliative Care Outcomes      Time In/Out: 0919-8022, 1115-1130, 1420-1440 Time Total: 70 min Greater than 50%  of this time was spent counseling and coordinating care related to the above assessment and plan.  Signed by: Vinie Sill, NP Palliative Medicine Team Pager # [phone] (M-F 8a-5p) Team Phone # [phone] (Nights/Weekends)

## 2018-11-30 NOTE — Progress Notes (Signed)
Spoke with on call social worker from [facility] to inform her of pts poor prognosis and continued decline with high probability of cardiac arrest.  She acknowledged the information and instructed a call back if pt does cardiac/respiratory arrest.  Marda Stalker, [place] Pager [phone] (please enter 7 digits) [place] Pager [phone] (please enter 7 digits)

## 2018-11-30 NOTE — Progress Notes (Signed)
Patient's EKG rhythm at 15:41-asystole. No spontaneous breathing, no lung sounds heard, and unable to palpitate pulses. Dr Mortimer Fries notified, [facility] supervisor notified, 3 daughters at bedside, and [place] notified. Called [place] donor ref number [phone]. Patient is not a suitable donor.

## 2018-11-30 NOTE — Progress Notes (Addendum)
Palliative:  Full note to follow. I have met at Deborah Case's bedside with daughter, Deborah Case, who has password to have information. We discussed her mother's multiorgan failure and poor prognosis. We have concern for cardiogenic shock and cardiac dysfunction with bradycardia and hypotension ongoing even with high dose dopamine to support. Her respiratory status is being supported by BiPAP and we are concerned for her renal function. Her blood sugar is also dropping critically low. These are all signs of severe organ dysfunction and really a sign that Deborah Case's body is shutting down and with the complexity of all her issues options are limited. We are recommending a focus on comfort care. Deborah Case understands that her mother is at EOL and our recommendation is to focus on comfort care and to remove BiPAP. She would like to remain at her mother's bedside and be present when she dies. I informed her that I will speak further with DSS regarding recommendations and will keep her informed. I have left voicemail x 2 with DSS legal guardian (1 voicemail for Deborah Case and 1 voicemail for her supervisor). Will continue attempts to clarify plan with DSS.   Update 1130: I have spoken with DSS legal guardian Deborah Case regarding recommendations and have submitted these recommendations via email for her to obtain approval from [place] supervisors. She will also speak with family and assist with recommendations for visitation. Will await to hear back.   Update 1350: Awaiting final decision from DSS and family have left bedside. At this time Deborah Case is having discomfort at times and is suffering. I will add PRN medication to minimize her suffering knowing that this may make her hypotension worse but ethically we will not allow her to suffer unnecessarily. RN to contact Deborah Case (daughter) with any further decline. Will await final DSS decision to remove BiPAP and dopamine infusion but will add PRN comfort medications. No  escalation of care at this time.   Plan: - Continue dopamine and BiPAP until DSS decision made and family at bedside as they desire.  - PRN fentanyl for comfort. May liberalize morphine and premedicate with morphine before BiPAP removed when/if approved by DSS.  - No escalation of care given poor prognosis and limited options.   Vinie Sill, NP Palliative Medicine Team Pager # [phone] (M-F 8a-5p) Team Phone # [phone] (Nights/Weekends)

## 2018-11-30 NOTE — Progress Notes (Signed)
Per Charlena Cross DSS would like [place] to wait for patients 3 daughters to start comfort care. 2 daughter are here, waiting for Carlyon Shadow the third daughter to get here.

## 2018-11-30 NOTE — Progress Notes (Signed)
All three daughters and 2 granddaughters at bedside. Comfort care started.

## 2018-11-30 DEATH — deceased

## 2020-02-27 IMAGING — CR RIGHT HUMERUS - 2+ VIEW
1 series · 2 of 2 positions shown · non-contrast
Comparison: 09/07/2018 shoulder radiograph

CLINICAL DATA: Fall today with upper right arm pain

EXAM:
RIGHT HUMERUS - 2+ VIEW

[Series 1: dg humerus right · 0.14mm/px · 2 of 2 slices shown]
[im 1/2]
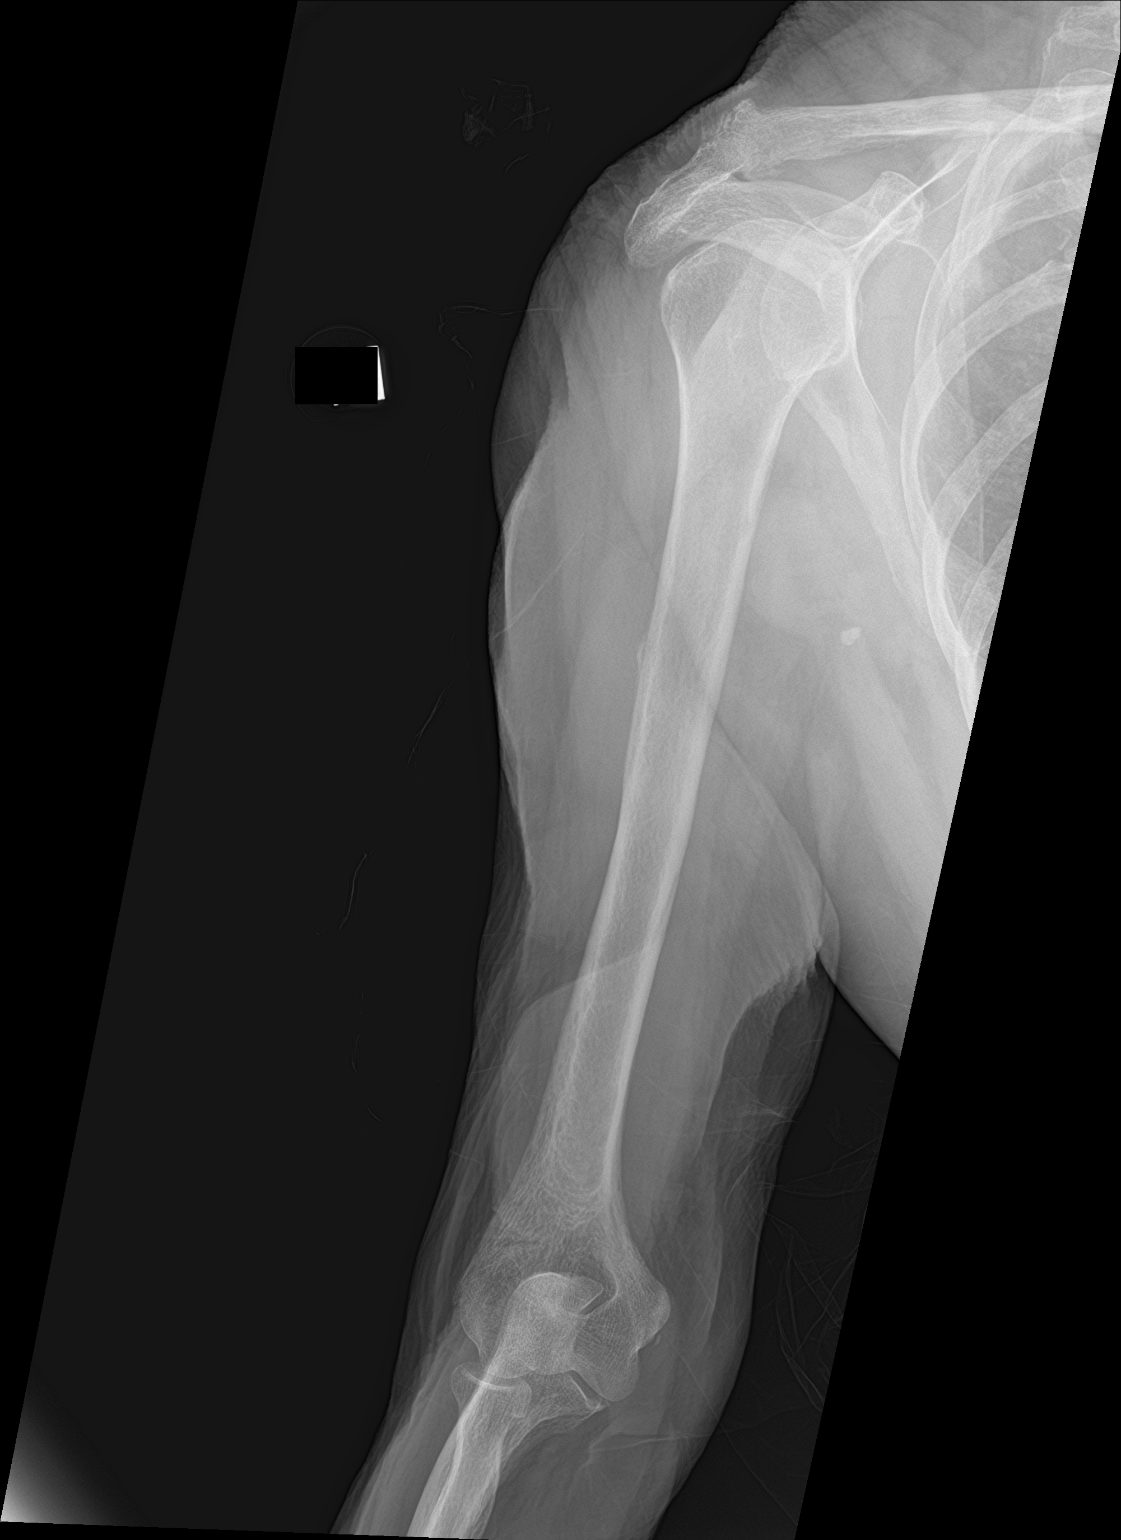
[im 2/2]
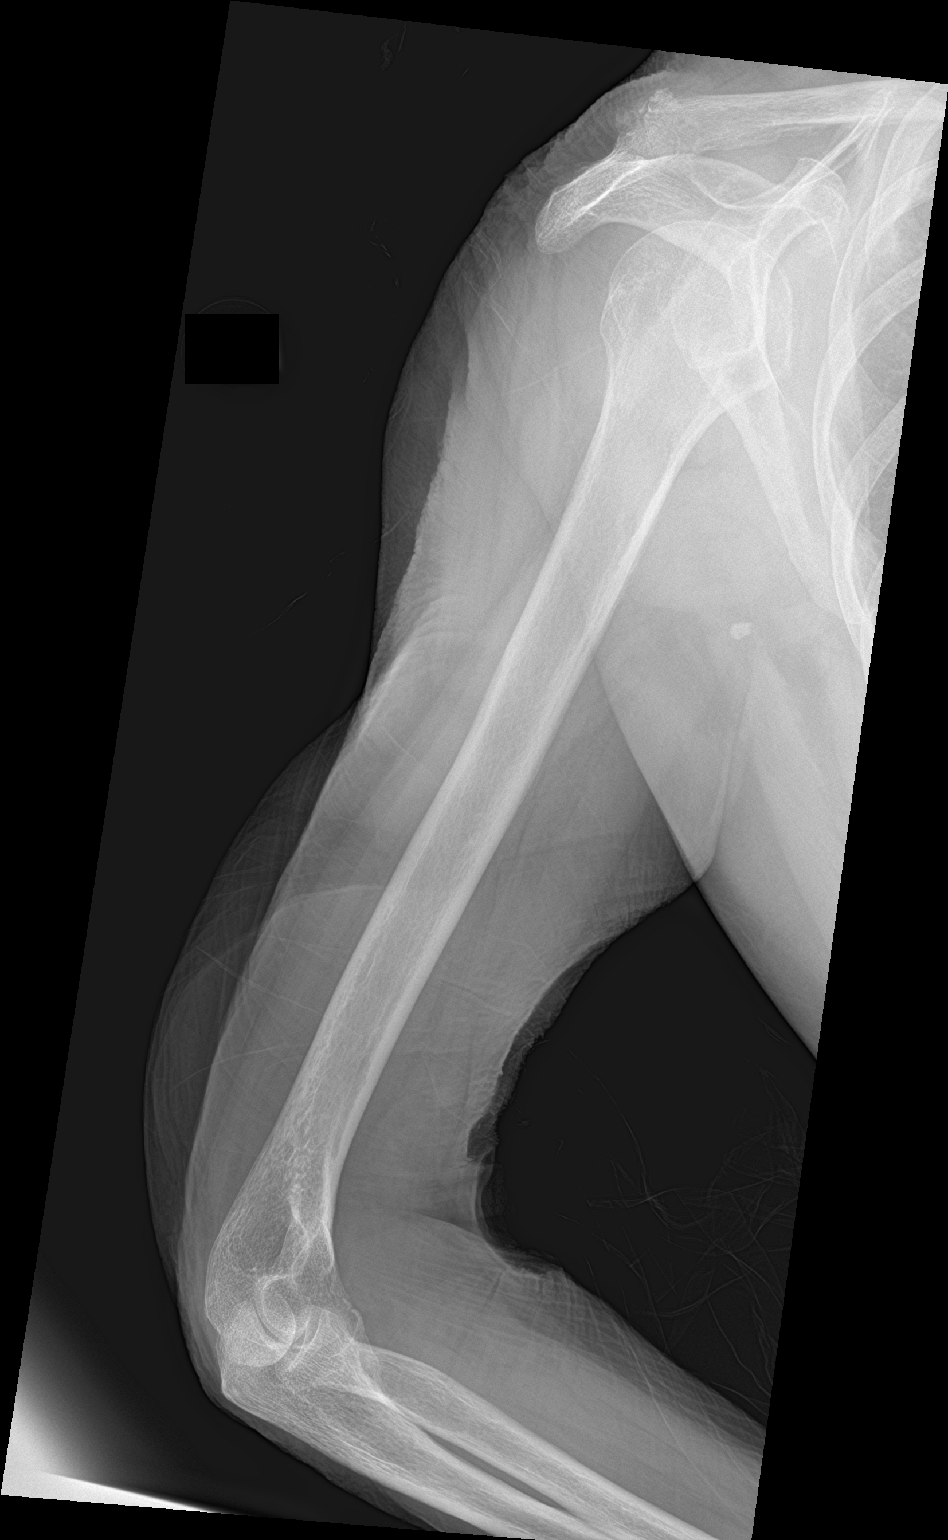

[2 of 2 positions shown; findings below may reference images not displayed]

FINDINGS: There is no evidence of fracture or other focal bone lesions.
Acromioclavicular osteoarthritis. Osteopenia.
IMPRESSION: No acute finding

## 2020-02-27 IMAGING — DX LEFT KNEE - COMPLETE 4+ VIEW
4 series · 4 of 4 positions shown · non-contrast
Comparison: Radiographs August 30, 2018.

CLINICAL DATA: Left knee pain after fall.

EXAM:
LEFT KNEE - COMPLETE 4+ VIEW

[knee ap]
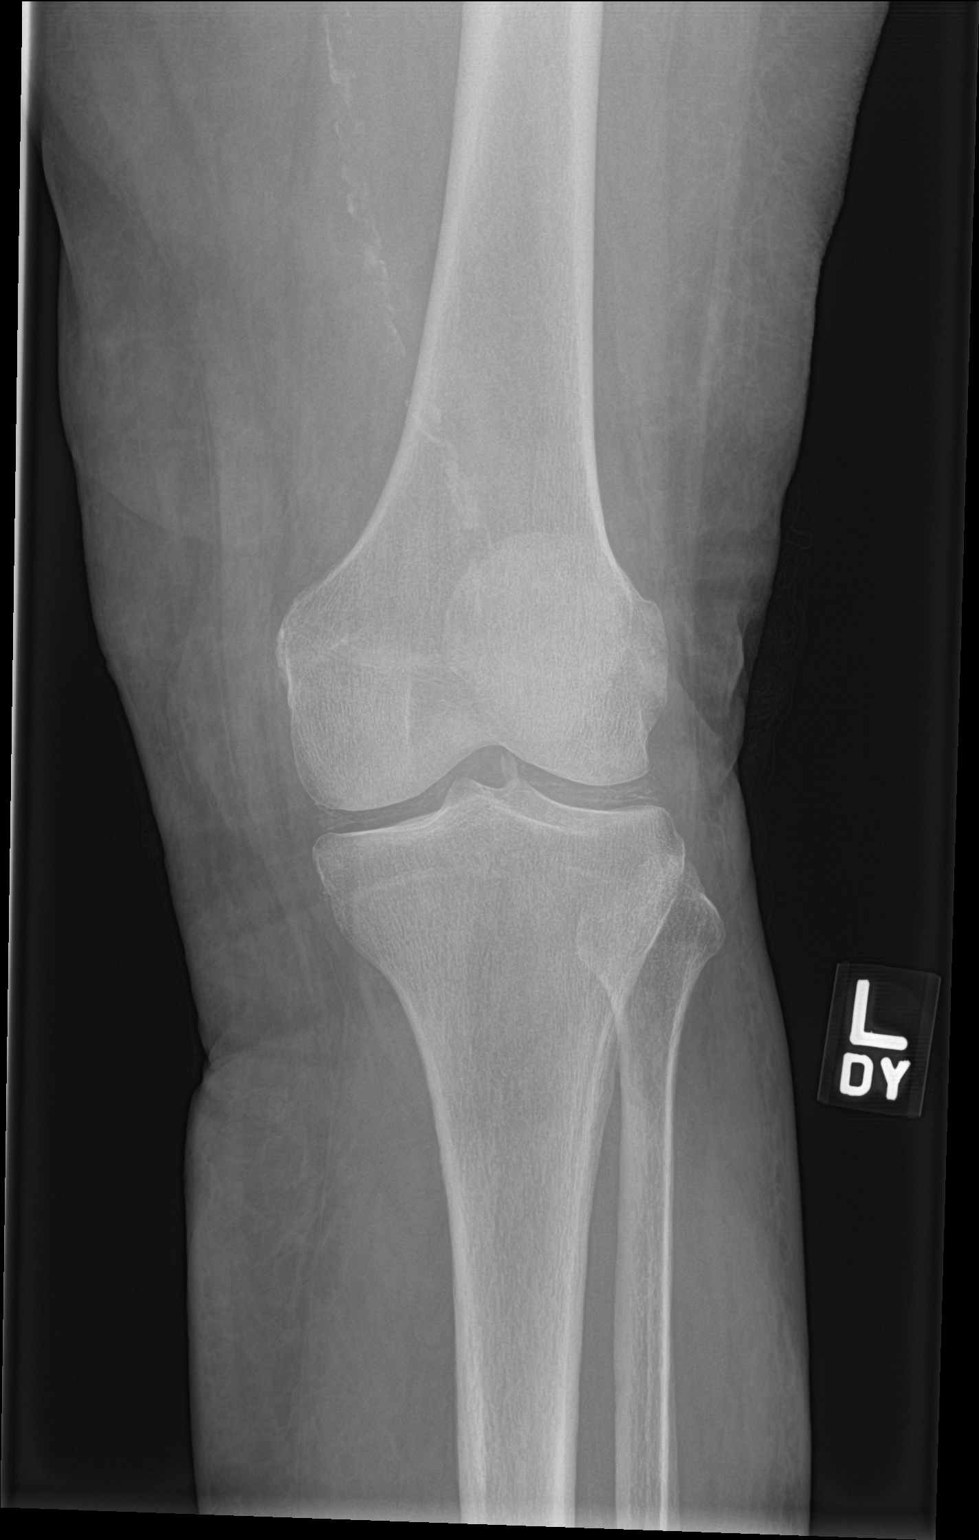

[knee lat]
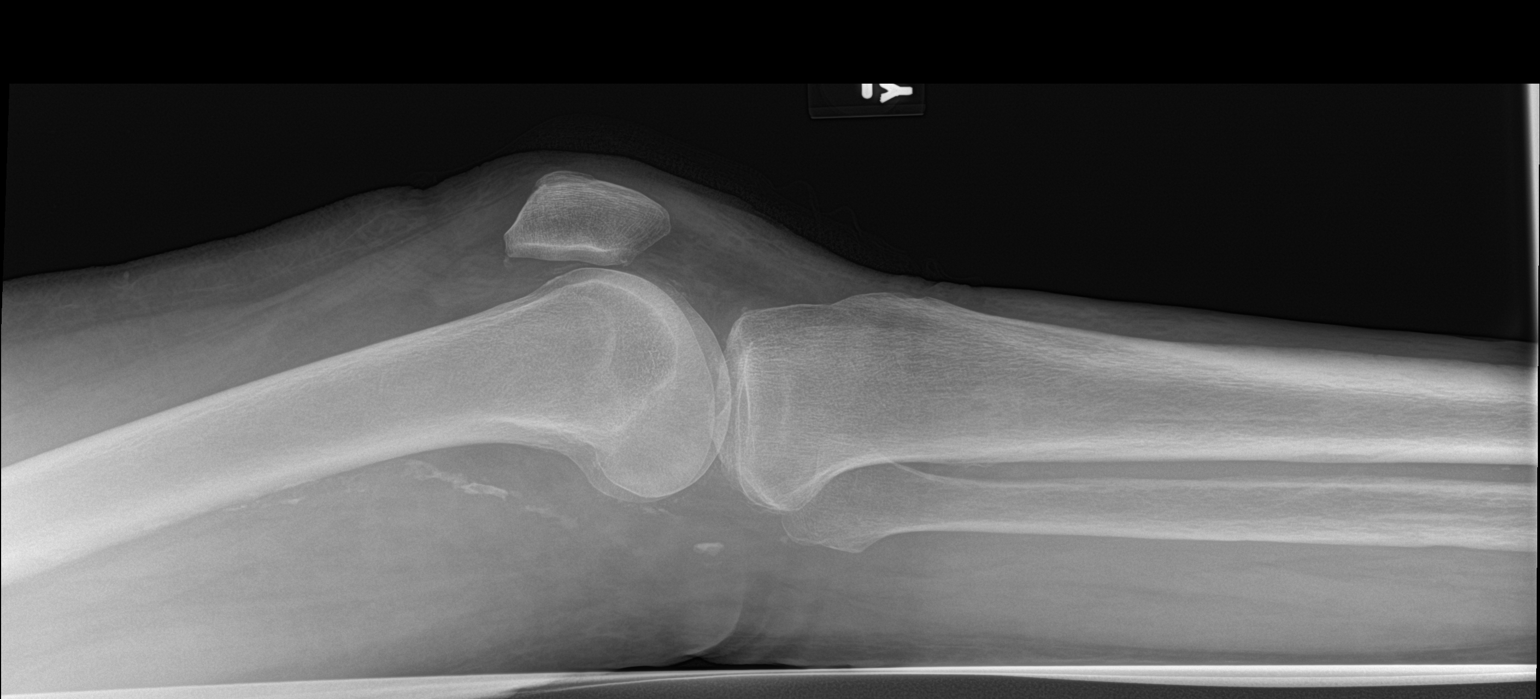

[knee obl (1 of 2)]
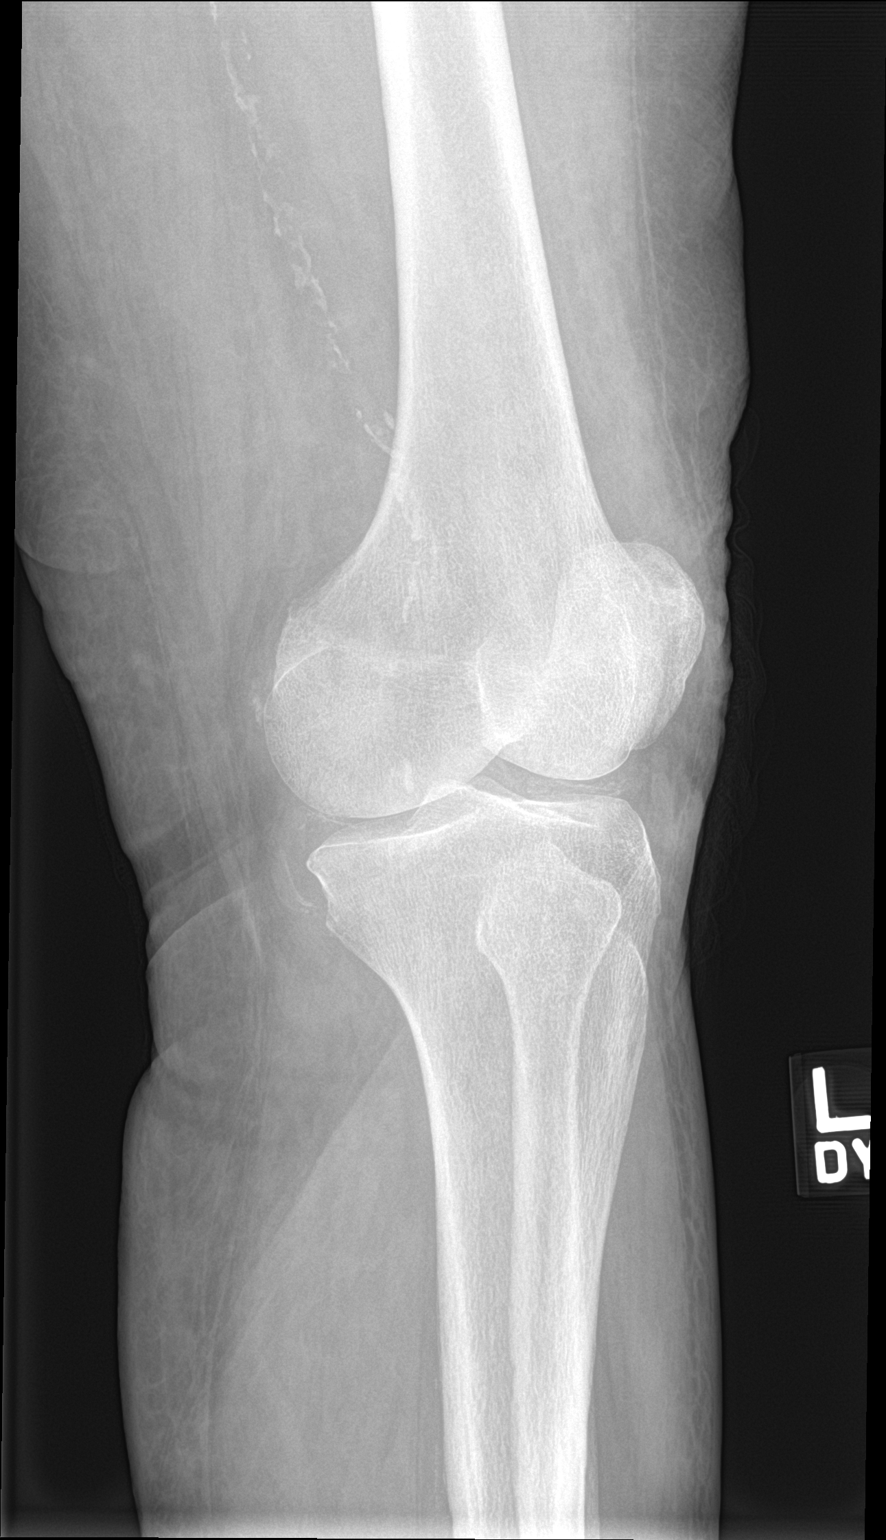

[knee obl (2 of 2)]
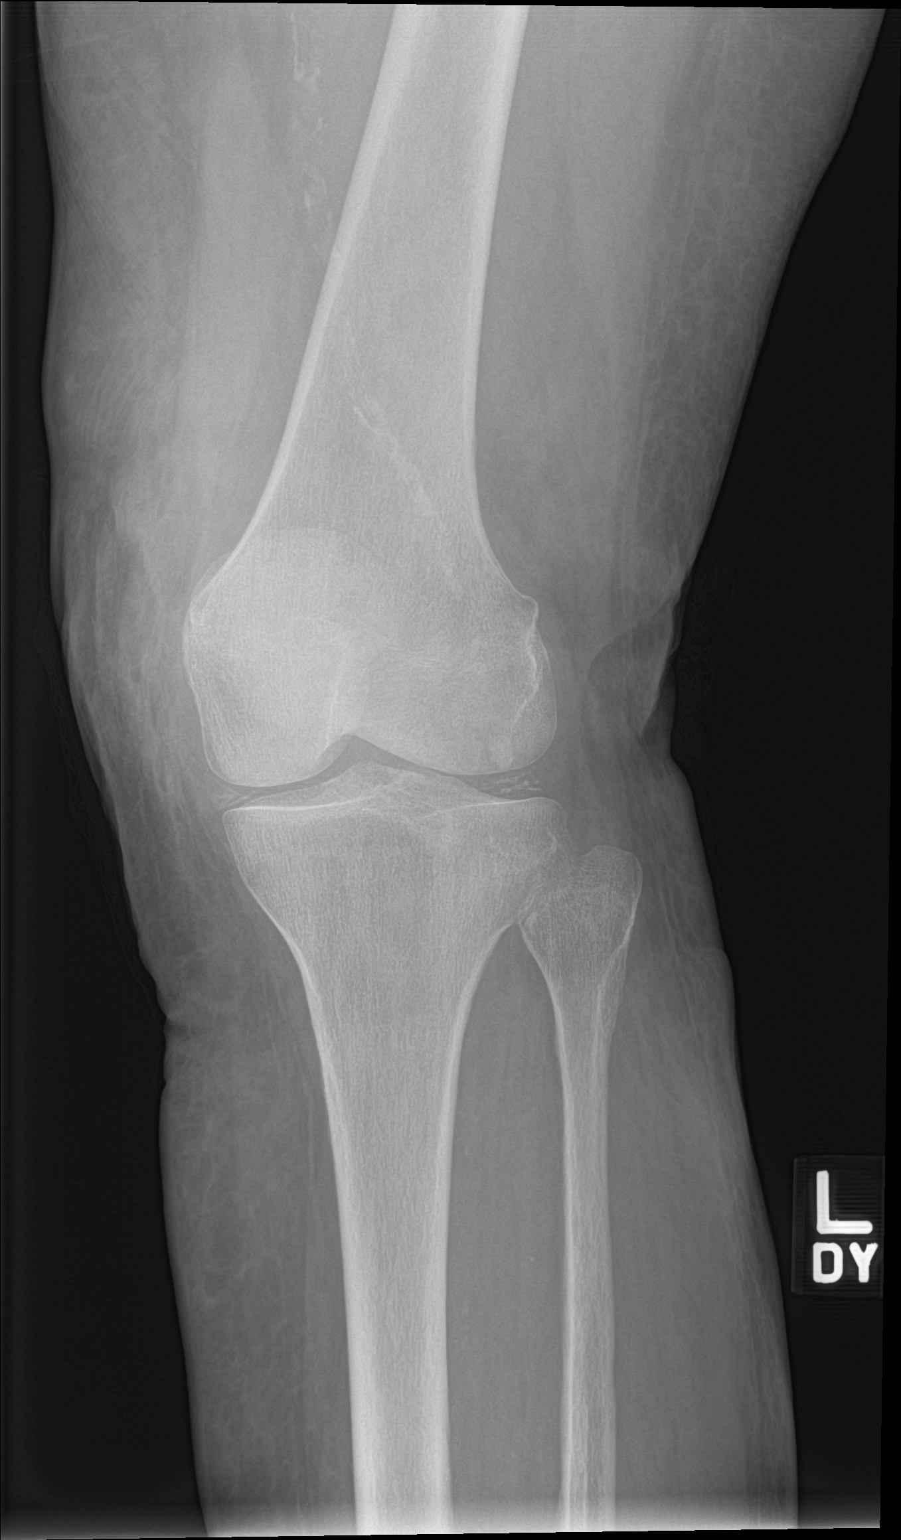

[4 of 4 positions shown; findings below may reference images not displayed]

FINDINGS: No evidence of fracture, dislocation, or joint effusion. Joint
spaces are intact. Vascular calcifications are noted.
Chondrocalcinosis is seen involving the medial and lateral joint
spaces.
IMPRESSION: Chondrocalcinosis is noted suggesting early degenerative joint
disease or possibly calcium pyrophosphate deposition disease. No
acute abnormality seen in the left knee.

## 2020-02-28 IMAGING — DX CHEST  1 VIEW
1 series · 1 of 1 positions shown · non-contrast
Comparison: 08/13/2018; 07/13/2018; chest CT-09/05/2018

CLINICAL DATA: Cough

EXAM:
CHEST  1 VIEW

[chest ap]
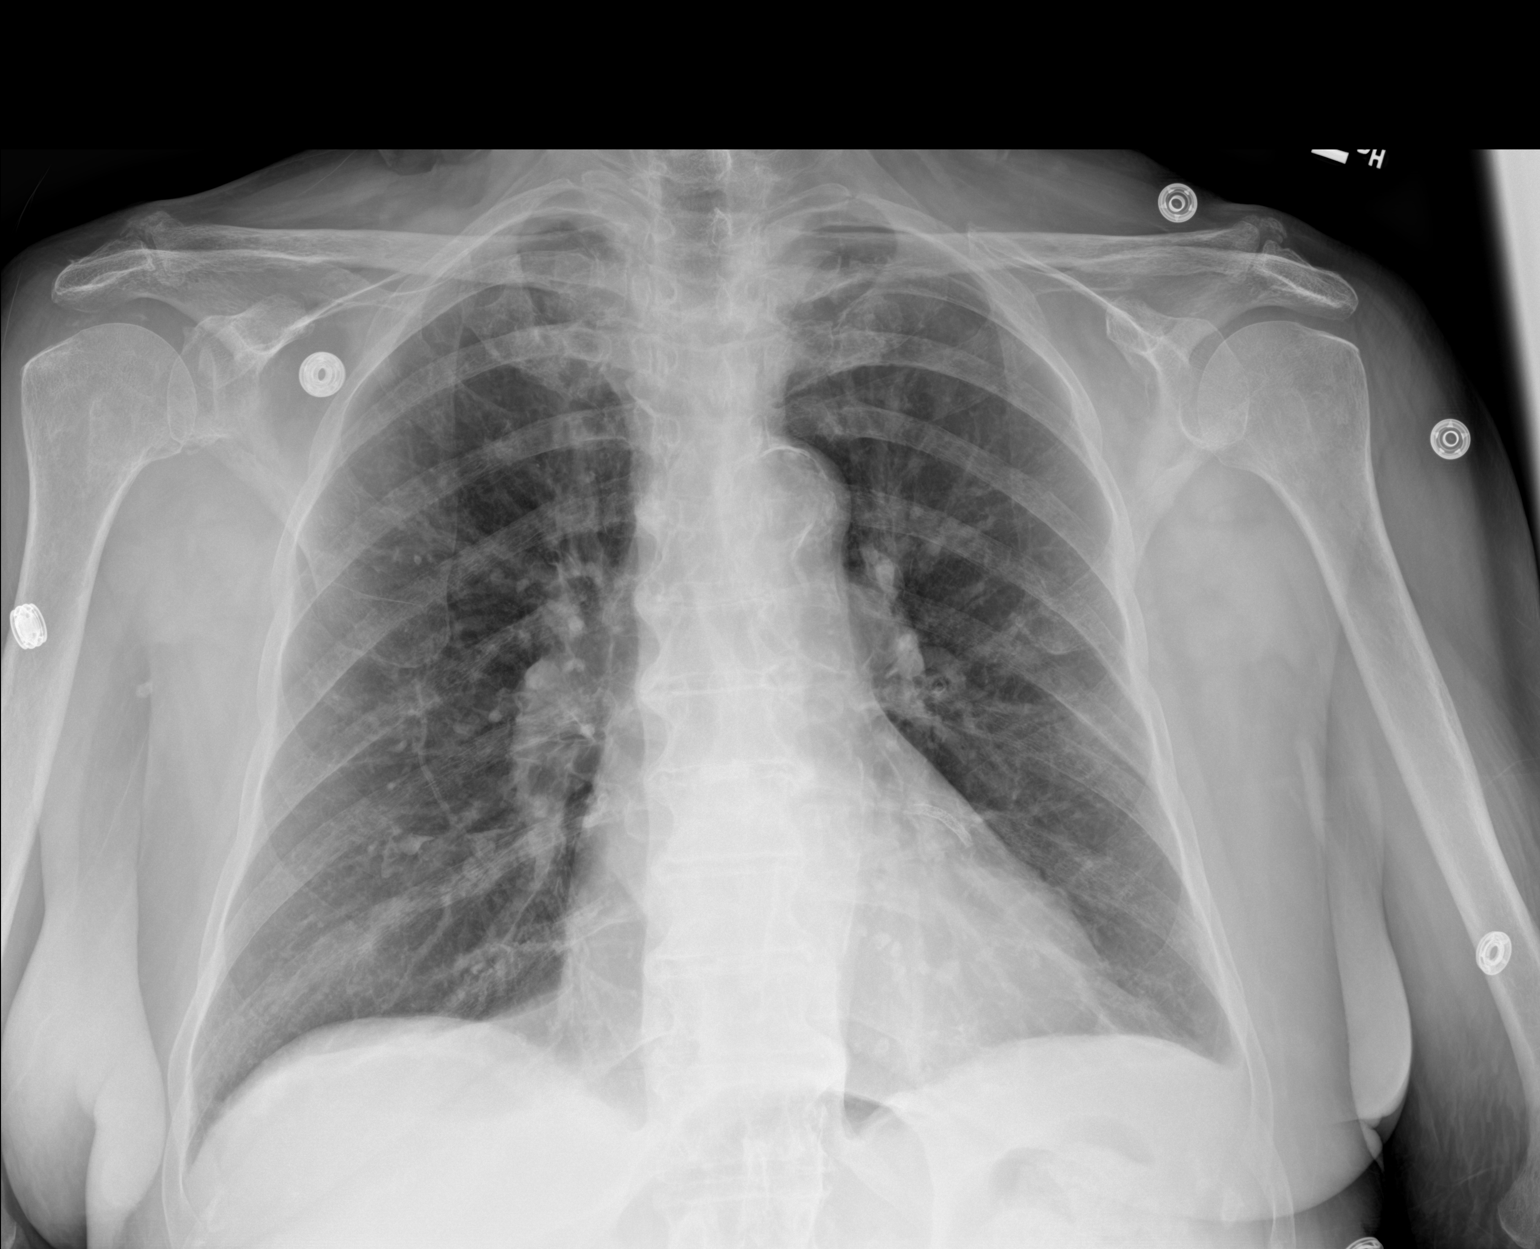

[1 of 1 positions shown; findings below may reference images not displayed]

FINDINGS: Unchanged cardiac silhouette and mediastinal contours.
Atherosclerotic plaque when the thoracic aorta. Post coronary stent
placement. No discrete focal airspace opacities. No pleural effusion
or pneumothorax. No evidence of edema. No acute osseus
abnormalities. Degenerative change of the bilateral AC joints,
incompletely evaluated.
IMPRESSION: No acute cardiopulmonary disease.
